# Patient Record
Sex: Male | Born: 1965
Health system: Western US, Academic
[De-identification: ages and names within clinical notes are randomized; demographics above are authoritative.]

---

## 2017-06-28 ENCOUNTER — Ambulatory Visit: Payer: Worker's Compensation

## 2017-06-28 ENCOUNTER — Ambulatory Visit: Payer: BLUE CROSS/BLUE SHIELD | Attending: Cardiovascular Disease

## 2017-06-28 DIAGNOSIS — I10 Essential (primary) hypertension: Secondary | ICD-10-CM

## 2017-06-28 DIAGNOSIS — R638 Other symptoms and signs concerning food and fluid intake: Secondary | ICD-10-CM

## 2017-06-28 DIAGNOSIS — H35039 Hypertensive retinopathy, unspecified eye: Secondary | ICD-10-CM

## 2017-06-28 DIAGNOSIS — E7849 Other hyperlipidemia: Secondary | ICD-10-CM

## 2017-06-28 NOTE — Patient Instructions
1. Record home blood pressure log for 1 week. Measure blood pressure twice a day. On day 1: check breakfast and dinner and on day 2: check lunch and bedtime.   2.

## 2017-06-28 NOTE — Consults
PRIMARY CARE PHYSICIAN: Office, Doctor's, MD    CARDIOLOGY CONSULTATION    History of Present Illness: As you know Paul Waller is a 52 y/o male who presents for evaluation of HTN and HL. Relatively inactive due to back with no CP, SOB, DOE, orthopnea, PND, palpitations or syncope. Patient seen by retina specialist and found to have retinopathy.     Review of Systems:  Gen: no fevers, chills, or night sweats.  HEENT: no acute visual or hearing changes; chronic hearing impairment; no new oral lesions or excess secretions.  CV: no chest pain, orthopnea, PND, palpitations, or syncope.  Resp: no SOB, DOE, cough, or hemoptysis.  GI: no diarrhea, constipation, hematochezia, or melena.  GU: no dysuria, urgency, or hematuria.  Musc: no acute joint swelling but R shoulder pain.  Skin: no acute rashes.  Neuro: no acute numbness, weakness, or seizures.  Psych: no anxiety or depression.  Endocrine: no heat or cold intolerance.  Heme/Lymph: no spontaneous bruising, bleeding, or lymph node swelling.  Allergic/Immunologic: no acute allergic reactions or hives.  Complete 14 system ROS performed and rest of ROS otherwise negative except as documented above.    Past Medical History:   Diagnosis Date   ??? Colon polyp    ??? Diverticulosis    ??? High cholesterol    ??? Hypertension    ??? Hypertensive retinopathy        Past Surgical History:   Procedure Laterality Date   ??? BACK SURGERY         Family History   Problem Relation Age of Onset   ??? No Known Problems Mother    ??? Heart disease Father         s/p CABG and stents with procedures during age 59's.    ??? No Known Problems Sister    ??? No Known Problems Brother    ??? No Known Problems Maternal Aunt    ??? No Known Problems Maternal Uncle    ??? No Known Problems Paternal Aunt    ??? No Known Problems Paternal Uncle    ??? No Known Problems Maternal Grandmother    ??? No Known Problems Maternal Grandfather    ??? No Known Problems Paternal Grandmother    ??? No Known Problems Paternal Grandfather Social History   Substance Use Topics   ??? Smoking status: Light Tobacco Smoker     Types: Cigars   ??? Smokeless tobacco: Never Used   ??? Alcohol use Yes       No Known Allergies    Medications that the patient states to be currently taking   Medication Sig   ??? amLODIPine-atorvastatin 10-20 mg tablet Take 1 tablet by mouth daily.   ??? meloxicam 15 mg tablet Take 15 mg by mouth daily.   ??? valsartan 80 mg tablet Take 80 mg by mouth daily.       EXAM:  BP 136/76  ~ Pulse 74  ~ Temp 36.8 ???C (98.2 ???F) (Oral)  ~ Ht 5' 10'' (1.778 m)  ~ Wt 190 lb (86.2 kg)  ~ SpO2 96%  ~ BMI 27.26 kg/m???   GEN: awake and alert x3, no acute distress. Able to converse.  HEENT: PERRL, EOMI,moist conjunctivae, oral cavity clear; ears and nose atraumatic.   NECK: No significant JVD; JVP <7cm H2O; Carotid upstroke normal with +2 carotids bilaterally with no significant bruits. No thyroid enlargement.  HEART: RRR with normal S1 and S2 with no S3 or S4 with no significant murmurs.  LUNGS:  CTAB bilaterally with no rales or wheezes. Normal respiratory effort.  ABD: soft, NT, ND, +BS.   EXT: non-tender with no edema and no varices.  VASC: +2 brachial and radial pulses bilaterally.  NEURO: non-focal with gross motor tone intact with sensation intact to light touch.  MUSC: no joint swelling or effusions with normal range of movement of upper and lower extremities.  SKIN: no acute rashes.  LYMPH: No lymphadenopathy along neck.    06/28/17 ECG: normal sinus rhythm with rate of 68 with QRS 82, QTc 389, PR 162.    ASSESSMENT:  1. Hypertension.  2. Hyperlipidemia.  3. Hypertensive retinopathy.  4. Diverticulosis/colon polyp.  5. Increased BMI.    PLAN:  1. Reviewed outside records. Will need to obtain most recent labs.  2. Patient to record home blood pressure log to determine need for adjustment in regimen.  3. Discussed dietary measures to optimize cardiac risk and weight. Patient to work on sodium restriction. 4. ECG performed and analyzed with no high risk features. Echocardiogram to assess for any structural heart disease and will need to consider risk stratification.  5. Continue current regimen for now. Will need to calculate long term risk after labs obtained and determine if aspirin needed for primary prevention.  6. Encouraged regular aerobic activity.  7. Will coordinate f/u visit in 4-5 weeks.     Thank you for the opportunity to participate in your patient's care.    Sincerely yours,    Paul Gasser, MD, [facility], [place]  [facility]

## 2017-08-09 ENCOUNTER — Ambulatory Visit: Payer: BLUE CROSS/BLUE SHIELD | Attending: Cardiovascular Disease

## 2017-08-09 DIAGNOSIS — H35039 Hypertensive retinopathy, unspecified eye: Secondary | ICD-10-CM

## 2017-08-09 DIAGNOSIS — E7849 Other hyperlipidemia: Secondary | ICD-10-CM

## 2017-08-09 NOTE — Progress Notes
PRIMARY CARE PHYSICIAN: Office, Doctor's, MD    CARDIOLOGY FOLLOW UP VISIT:    S: As you know Paul Waller is a 52 y/o male who presents for evaluation of HTN and HL. Patient has been working on improving diet and eating more vegetables. Patient home BP had morning high pressures. Patient with no CP, SOB, DOE, orthopnea, PND, palpitations or syncope.     REVIEW OF SYSTEMS:  Gen: no fevers, chills, or night sweats.  HEENT: no acute visual or hearing changes; chronic hearing impairment; no new oral lesions or excess secretions.  CV: no chest pain, orthopnea, PND, palpitations, or syncope.  Resp: no SOB, DOE, cough, or hemoptysis.  GI: no diarrhea, constipation, hematochezia, or melena.  GU: no dysuria, urgency, or hematuria.  Musc: no acute joint swelling but R shoulder pain.  Skin: no acute rashes.  Neuro: no acute numbness, weakness, or seizures.  Psych: no anxiety or depression.  Endocrine: no heat or cold intolerance.  Heme/Lymph: no spontaneous bruising, bleeding, or lymph node swelling.  Allergic/Immunologic: no acute allergic reactions or hives.  Complete 14 system ROS performed and rest of ROS otherwise negative except as documented above.    Past Medical History:   Diagnosis Date   ??? Colon polyp    ??? Diverticulosis    ??? High cholesterol    ??? Hypertension    ??? Hypertensive retinopathy        Past Surgical History:   Procedure Laterality Date   ??? BACK SURGERY         Family History   Problem Relation Age of Onset   ??? No Known Problems Mother    ??? Heart disease Father         s/p CABG and stents with procedures during age 83's.    ??? No Known Problems Sister    ??? No Known Problems Brother    ??? No Known Problems Maternal Aunt    ??? No Known Problems Maternal Uncle    ??? No Known Problems Paternal Aunt    ??? No Known Problems Paternal Uncle    ??? No Known Problems Maternal Grandmother    ??? No Known Problems Maternal Grandfather    ??? No Known Problems Paternal Grandmother    ??? No Known Problems Paternal Grandfather Social History Main Topics   ??? Smoking status: Light Tobacco Smoker     Types: Cigars   ??? Smokeless tobacco: Never Used      Comment: quit cigarette use 14 years ago for 20 yrs.   ??? Alcohol use Yes   ??? Drug use: Unknown       No Known Allergies    Medications that the patient states to be currently taking   Medication Sig   ??? amLODIPine-atorvastatin 10-20 mg tablet Take 1 tablet by mouth daily.   ??? meloxicam 15 mg tablet Take 15 mg by mouth daily.   ??? valsartan 80 mg tablet Take 80 mg by mouth daily.       EXAM:  BP 109/70  ~ Pulse 75  ~ Temp 36.3 ???C (97.4 ???F)  ~ Ht 5' 10'' (1.778 m)  ~ Wt 189 lb 9.6 oz (86 kg)  ~ SpO2 98%  ~ BMI 27.20 kg/m???   GEN: awake and alert x3, no acute distress. Able to converse.  HEENT: PERRL, EOMI,moist conjunctivae, oral cavity clear; ears and nose atraumatic.   NECK: No significant JVD; JVP <7cm H2O; Carotid upstroke normal with +2 carotids bilaterally with no significant bruits.  No thyroid enlargement.  HEART: RRR with normal S1 and S2 with no S3 or S4 with no significant murmurs.  LUNGS: CTAB bilaterally with no rales or wheezes. Normal respiratory effort.  ABD: soft, NT, ND, +BS.   EXT: non-tender with no edema and no varices.  VASC: +2 brachial and radial pulses bilaterally.  NEURO: non-focal with gross motor tone intact with sensation intact to light touch.  MUSC: no joint swelling or effusions with normal range of movement of upper and lower extremities.  SKIN: no acute rashes.  LYMPH: No lymphadenopathy along neck.  ???  06/28/17 ECG: normal sinus rhythm with rate of 68 with QRS 82, QTc 389, PR 162.  ???  07/05/17 Echocardiogram:   1. Normal left ventricular size and wall thickness.   2. Left ventricular ejection fraction is approximately 55 to 60%.   3. The calculated ejection fraction (Simpson's) is 58 %.   4. Normal LV diastolic function.   5. No significant valvular lesions.   6. No pericardial effusion.   7. No pulmonary hypertension.    ASSESSMENT:  1. Hypertension. 2. Hyperlipidemia.  3. Hypertensive retinopathy.  4. Diverticulosis/colon polyp.  5. Increased BMI.  ???  PLAN:  1. Overall cardiac status stable. Patient improving dietary habits.  2. Discussed echocardiogram to assess for structural heart disease.  3. Home BP log with morning hypertension. Patient to take valsartan in am and amlodipine/atorvastatin in evening.  4. Discussed dietary measures to optimize cardiac risk and weight. Patient to work on sodium restriction.  5. Continue current regimen for now. Will need to calculate long term risk after labs obtained and determine if aspirin needed for primary prevention.  6. Encouraged regular aerobic activity.  7. Will coordinate f/u visit in 3 months.   ???  Thank you for the opportunity to continue to participate in your patient's care.    Sincerely yours,     Nani Gasser, MD, [facility], [facility]  [facility]

## 2017-08-09 NOTE — Patient Instructions
1. Take valsartan in am and amlodipine/atorvastatin in evening before sleep.  2. Call in 2-3 weeks to see if labs are received.

## 2017-08-10 NOTE — Progress Notes
Request has been sent

## 2017-08-16 NOTE — Progress Notes
Faxed over request for records.

## 2017-09-06 MED ORDER — MELOXICAM 15 MG PO TABS
15 mg | ORAL_TABLET | Freq: Every day | ORAL | 0 refills
Start: 2017-09-06 — End: ?

## 2017-09-07 MED ORDER — VALSARTAN 80 MG PO TABS
80 mg | ORAL_TABLET | Freq: Every day | ORAL | 3 refills | Status: AC
Start: 2017-09-07 — End: ?

## 2017-09-07 MED ORDER — AMLODIPINE-ATORVASTATIN 10-20 MG PO TABS
1 | ORAL_TABLET | Freq: Every day | ORAL | 3 refills | Status: AC
Start: 2017-09-07 — End: 2018-09-02

## 2017-11-08 ENCOUNTER — Ambulatory Visit: Payer: BLUE CROSS/BLUE SHIELD | Attending: Cardiovascular Disease

## 2017-11-29 DIAGNOSIS — S43431A Superior glenoid labrum lesion of right shoulder, initial encounter: Secondary | ICD-10-CM

## 2017-11-30 ENCOUNTER — Ambulatory Visit: Payer: BLUE CROSS/BLUE SHIELD

## 2017-11-30 DIAGNOSIS — M5030 Other cervical disc degeneration, unspecified cervical region: Secondary | ICD-10-CM

## 2017-11-30 DIAGNOSIS — S39012A Strain of muscle, fascia and tendon of lower back, initial encounter: Secondary | ICD-10-CM

## 2017-11-30 DIAGNOSIS — R52 Pain, unspecified: Secondary | ICD-10-CM

## 2017-11-30 MED ORDER — METHYLPREDNISOLONE 4 MG PO TBPK
ORAL_TABLET | 0 refills | Status: AC
Start: 2017-11-30 — End: ?

## 2017-11-30 NOTE — Progress Notes
Date:  11/30/2017  Name:  Paul Waller  ACCT:#:  [id number]  DOB:   16-Nov-1965  Age:   52 y.o.        [name]. Paul Merl, MD    The patient was seen in our [place] office.     CHIEF COMPLAINTS:  Low back pain    HISTORY OF PRESENT ILLNESS:  The patient is a 52 y.o. male who presents with a 5 day history of low back pain.  He reports that the pain is mainly located across the lower back.  He reports the pain is a 7/10 intensity.  He denies any specific history of trauma.  He reports the pain is better with Aleve and ice.  He has a past medical history significant for hypertension.  He is currently taking Aleve as needed for the pain.  He does report some intermittent pain that radiates his right thigh.  He denies any significant weakness or numbness in the bilateral lower extremities.  He denies any issues with bowel or bladder control.  He denies any saddle anesthesia.  He has not had any imaging.  He has not had any injections.  He denies any known history of cancer.  He has not had a course of physical therapy.    Patient Medical History: The patient???s intake sheet, including past medical history, past surgical history, medicines, allergies, social and family history was reviewed by myself and the patient today, these are noted and documented in the patient???s file.    REVIEW OF SYSTEMS: The review of systems as documented today in the medical record is remarkable for the positive orthopedic problems discussed above and is otherwise non-contributory with respect to Constitutional, ENT, Cardiovascular, GU, Skin, Neurologic, Endocrine, Hematologic, Psychiatric, Gastrointestinal, Respiratory, Eyes and Allergic/Immunologic systems except as noted on the intake form.      OBJECTIVE FINDINGS:     Vitals: Ht 5' 10'' (1.778 m)  ~ Wt 192 lb (87.1 kg)  ~ BMI 27.55 kg/m???     GENERAL: The patient is a well-developed, well nourished male in no acute distress. HEENT: Normocephalic, atraumatic. Pupils equally round, reactive to light. Extraocular motion is intact.   NECK: Supple without lymphadenopathy. Trachea midline.   RESPIRATIONS: Regular and unlabored without abnormal intercostal retractions or abnormal diaphragmatic movement.   CARDIOVASCULAR: No cyanosis, clubbing, or edema.   SKIN: Without lesions, masses, or rash.   PSYCHIATRIC: Normal affect, insight, and mood.     PHYSICAL EXAMINATION:   LUMBAR SPINE EXAMINATION:    Gait and Station  The patient arises from seated to standing without difficulty. The patient stands with level shoulders and pelvis.    Normal lumbar lordosis and thoracic kyphosis. Gait is normal without limp or weakness. Toe and heel walking ar without observed deficit.     Scars/Masses  The skin is normal without lesions or masses.      Range Of Motion   % of Normal   Back  Pain (+/-)  Leg Pain (+/-)    Lumbar Flexion  Moderately decreased   +    -  Lumbar Extension  Moderately decreased   +    -  Right Lateral Flexion Moderately decreased   +    -  Left Lateral Flexion  Moderately decreased   +    -    Motor Strength        Right Left  Hip Flexors    5  5   Quadriceps    5  5  Tibialis Anterior   5  5   Extensor Hallucis Longus 5  5   Ankle Plantarflexors  5  5        Sensation  Light touch sensation is intact in both lower extremities.    Reflexes     Right Left  Patellar       2  2   Achilles       2  2     FABER     Negative Negative  Hip Range of Motion   Normal  Normal   Seated Straight Leg Raise Negative Negative  Supine Straight Leg Raise Negative Negative    Tenderness   Lumbosacral midline   None  Paralumbar muscles   None   Right Sciatic Notch   None  Left Sciatic Notch   None     Spasm in paraspinal muscles  Absent  Leg Lengths    Equal  Thigh Circumference   Equal  Calf Circumference   Equal     RADIOGRAPHS: Taken today at our [facility] office and reviewed by me show: 2 Views of Lumbar Spine: There are five lumbar vertebrae. There is moderate loss of normal lumbar lordosis. Coronal alignment reveals asymmetric disc space collapse at L4-5 (right > than left). Vertebral body heights are within normal limits. Disc heights are severely decreased at L4-5 and L5-S1 There is no evidence of spondylolysis or spondylolisthesis.      DIAGNOSES:  1. Severe lumbar degenerative disc disease L4-S1 with Lumbosacral strain    DISCUSSION: A thorough discussion regarding the patient???s diagnosis was discussed in detail.  All of the patient???s questions were answered prior to the conclusion of this visit.  Patient has no gross neurological deficits on physical exam.  Patient is experiencing low back pain that does not radiate below the knee and decreased range of motion.  I feel that the patient???s symptoms are secondary to significant degenerative disc disease from L4-S1 with lumbosacral strain.  I recommend that the patient undergo a trial of conservative management.  A prescription for physical therapy has been given to the patient for inflammation/edema control, stretching, range of motion, and core strengthening.  The patient will follow-up in our office in 4-6 weeks after completing a course of physical therapy.  If there symptoms fail to improve or the experience any motor or sensory changes they have been instructed to call our office at her earlier time at which point we may consider obtaining a MRI of the lumbar spine.  For pain, he was started on a Medrol Dosepak to be taken as directed.  He was instructed not to take this with any other anti-inflammatory medication.         FOLLOW UP:  4-6 weeks for repeat evaluation    ''Disclaimer: This document was generated using a voice recognition system, which may produce sporadic inaccurate transcription or nonsensical phrases.''

## 2017-12-16 ENCOUNTER — Ambulatory Visit: Attending: Medical

## 2018-01-16 ENCOUNTER — Ambulatory Visit: Attending: Medical

## 2018-01-16 NOTE — Progress Notes
01/16/2018      [place] [employer]  [address]  [place], [place]  [address]        RE:   Paul Waller, Paul Waller  DOB:  1966-03-15  EMP:  [employer] [employer]  D/I:  01/13/2017  CL#:  [id number]  ACCT#:  [id number]      SECONDARY TREATING PHYSICIAN'S PROGRESS REPORT          Barnabas Lister, MD  Gracy Racer, PA-C    CHIEF COMPLAINT:  Right shoulder pain    INTERIM HISTORY: Jacinto presents today for reevaluation of his right shoulder. He began experiencing right shoulder pain packing items into the work truck on January 13, 2017. He was given a cortisone injection several months ago reports that this did not help. Today he reports only minimal improvement with PT. He tried to go back to work with restrictions but reports that this has flared the shoulder pain.  He also reports he has injured his back and is now seeing a spine specialist.  He denies any changes in symptoms since last visit.  He has not done any more physical therapy since last visit.    The pain is dull to sharp in quality, 5/10 in severity, and is localized to the lateral aspect.  He can use have pain with repetitive activities, work, ADLs, and reaching. He admits he has clicking in the right shoulder. He denies instability, locking, catching or swelling. The patient has pain sleeping through the night.    REVIEW OF SYSTEMS: He denies numbness. He denies skin swelling.  Past Medical History:   Diagnosis Date   ??? Colon polyp    ??? Diverticulosis    ??? High cholesterol    ??? Hypertension    ??? Hypertensive retinopathy        SOCIAL HISTORY:    Social History     Social History   ??? Marital status: Married     Spouse name: N/A   ??? Number of children: N/A   ??? Years of education: N/A     Social History Main Topics   ??? Smoking status: Never Smoker   ??? Smokeless tobacco: Never Used      Comment: quit cigarette use 14 years ago for 20 yrs.   ??? Alcohol use Yes   ??? Drug use: Unknown   ??? Sexual activity: Not on file     Other Topics Concern   ??? Not on file Social History Narrative   ??? No narrative on file       PHYSICAL EXAM:   Constitutional: [name] is a 51y-old male in no acute distress.   Psych: He is alert and oriented x3, with a normal mood and affect.  ENT: Hearing is assisted.  Respiratory: Normal rate and effort despite the musculoskeletal condition.   Neuro: Speech is normal. Skin sensation is intact distally.  Vascular: Pulses are intact distally.  Lymphatic: No evidence of Lymphadenopathy or lymphedema.  Skin: No edema. No cyanosis.  Neck: FROM, negative spurling test.  Musculoskeletal:   Right shoulder exam:   He walks with a normal gait.   Pain localizes to the deep anterior.   INSPECTION: Shoulder reveals - effusion, - ecchymosis. Skin is intact.   PALPATION: + acromioclavicular joint tenderness. - bicipital groove tenderness, - lateral deltoid tenderness.   RANGE OF MOTION (degrees): FF:140 with pain, ABD:80 with pain, ER: 30, IR: T11, SER: 60, SIR: 60.   STRENGTH: ABD: 4/5 with 2+ pain, ER: 4-/5 with 2+  pain, IR: 4/5 with 1+ pain, Lag Sign: negative  SPECIAL TESTS: + impingement test. + speeds test. Belly press test cause pain.     IMPRESSION:   1. 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    2. S/p injury at work on 01/13/17.  3. No relief s/p biceps injection 01/31/17.   4. Mild improvement with PT.       TREATMENT PLAN: Reviewed my findings with the patient. I recommended the patient:   1. Resubmit for the surgery of a right shoulder arthroscopy with DCE, bicep tenodesis, SLAP debridement vs repair.  2. Advised to continue with physical therapy as scheduled, we'll resubmit for additional 12 sessions to work on the strengthening of the shoulder to better improve his recovery after the operation.      All questions were answered with stated understanding.      NEXT APPOINTMENT:  The patient will follow up in 6 weeks. DISABILITY STATUS: Temporarily Partially Disabled.     If you have any questions regarding this report, please do not hesitate to contact me.    DISCLOSURE:  I declare under penalty of perjury that I have not violated Labor Code Section 139.3.    The contents of this report and bill are true and correct to the best of my knowledge.    The patient was examined and evaluated by Gracy Racer, PA-C for Barnabas Lister, MD. The evaluation and plan was reviewed and approved by Barnabas Lister, MD.

## 2018-03-03 ENCOUNTER — Ambulatory Visit: Payer: BLUE CROSS/BLUE SHIELD | Attending: Medical

## 2018-03-03 NOTE — Progress Notes
03/03/2018      [employer]  [address]  [place], [place]  [address]        RE:   Paul Waller, Paul Waller  DOB:  02/21/1966  EMP:  [employer]  D/I:  01/13/2017  CL#:  [id number]  ACCT#:  [id number]      SECONDARY TREATING PHYSICIAN'S PROGRESS REPORT          Barnabas Lister, MD  Gracy Racer, PA-C    CHIEF COMPLAINT:  Right shoulder pain    INTERIM HISTORY: Paul Waller presents today for reevaluation of his right shoulder. He began experiencing right shoulder pain packing items into the work truck on January 13, 2017. He was given a cortisone injection several months ago reports that this did not help. Today he reports only minimal improvement with PT. He tried to go back to work with restrictions but reports that this has flared the shoulder pain.  He also reports he has injured his back and is now seeing a spine specialist.  He denies any changes in symptoms since last visit. He continues to have the weakness and tingling throughout the shoulder and arm. He has not done any more physical therapy since last visit.    The pain is dull to sharp in quality, 5/10 in severity, and is localized to the lateral aspect.  He can use have pain with repetitive activities, work, ADLs, and reaching. He admits he has clicking in the right shoulder. He denies instability, locking, catching or swelling. The patient has pain sleeping through the night.    REVIEW OF SYSTEMS: He denies numbness. He denies skin swelling.  Past Medical History:   Diagnosis Date   ??? Colon polyp    ??? Diverticulosis    ??? High cholesterol    ??? Hypertension    ??? Hypertensive retinopathy        SOCIAL HISTORY:    Social History     Social History   ??? Marital status: Married     Spouse name: N/A   ??? Number of children: N/A   ??? Years of education: N/A     Social History Main Topics   ??? Smoking status: Never Smoker   ??? Smokeless tobacco: Never Used      Comment: quit cigarette use 14 years ago for 20 yrs.   ??? Alcohol use Yes   ??? Drug use: Unknown ??? Sexual activity: Not on file     Other Topics Concern   ??? Not on file     Social History Narrative   ??? No narrative on file       PHYSICAL EXAM:   Constitutional: Paul Waller is a 51y-old male in no acute distress.   Psych: He is alert and oriented x3, with a normal mood and affect.  ENT: Hearing is assisted.  Respiratory: Normal rate and effort despite the musculoskeletal condition.   Neuro: Speech is normal. Skin sensation is intact distally.  Vascular: Pulses are intact distally.  Lymphatic: No evidence of Lymphadenopathy or lymphedema.  Skin: No edema. No cyanosis.  Neck: FROM, negative spurling test.  Musculoskeletal:   Right shoulder exam:   He walks with a normal gait.   Pain localizes to the deep anterior.   INSPECTION: Shoulder reveals - effusion, - ecchymosis. Skin is intact.   PALPATION: + acromioclavicular joint tenderness. - bicipital groove tenderness, - lateral deltoid tenderness.   RANGE OF MOTION (degrees): FF:140 with pain, ABD:80 with pain, ER: 30, IR: T11, SER: 60,  SIR: 60.   STRENGTH: ABD: 4/5 with 2+ pain, ER: 4-/5 with 2+ pain, IR: 4/5 with 1+ pain, Lag Sign: negative  SPECIAL TESTS: + impingement test. + speeds test. Belly press test cause pain.     IMPRESSION:   1. 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    2. S/p injury at work on 01/13/17.  3. No relief s/p biceps injection 01/31/17.   4. Mild improvement with PT.       TREATMENT PLAN: Reviewed my findings with the patient. I recommended the patient:   1. The surgery is still pending. So we will resubmit for the surgery of a right shoulder arthroscopy with DCE, bicep tenodesis, SLAP debridement vs repair.  2. Advised to continue with physical therapy as scheduled.      All questions were answered with stated understanding.      NEXT APPOINTMENT:  The patient will follow up in 6 weeks.     DISABILITY STATUS: Temporarily Partially Disabled. If you have any questions regarding this report, please do not hesitate to contact me.    DISCLOSURE:  I declare under penalty of perjury that I have not violated Labor Code Section 139.3.    The contents of this report and bill are true and correct to the best of my knowledge.    The patient was examined and evaluated by Gracy Racer, PA-C for Barnabas Lister, MD. The evaluation and plan was reviewed and approved by Barnabas Lister, MD.

## 2018-04-07 ENCOUNTER — Ambulatory Visit: Payer: BLUE CROSS/BLUE SHIELD | Attending: Medical

## 2018-04-07 ENCOUNTER — Telehealth: Payer: BLUE CROSS/BLUE SHIELD

## 2018-04-07 DIAGNOSIS — R52 Pain, unspecified: Secondary | ICD-10-CM

## 2018-04-07 NOTE — Progress Notes
04/07/2018      [employer] [facility] [employer]  [address]  [place], [place]  [address]        RE:   Paul Waller, Paul Waller  DOB:  19-Nov-1965  EMP:  [employer]  D/I:  01/13/2017  CL#:  [id number]  ACCT#:  [id number]      SECONDARY TREATING PHYSICIAN'S PROGRESS REPORT          Barnabas Lister, MD  Gracy Racer, PA-C    CHIEF COMPLAINT:  Right shoulder pain    INTERIM HISTORY: Paul Waller presents today for reevaluation of his right shoulder. He began experiencing right shoulder pain packing items into the work truck on January 13, 2017. He was given a cortisone injection several months ago reports that this did not help. Today he reports only minimal improvement with PT. He tried to go back to work with restrictions but reports that this has flared the shoulder pain.  He also reports he has injured his back and is now seeing a spine specialist.  Since his last visit, he denies any changes in symptoms. He continues to have the weakness and decreased ROM throughout the shoulder and arm.     The pain is dull to sharp in quality, 5/10 in severity, and is localized to the lateral aspect.  He can use have pain with repetitive activities, work, ADLs, and reaching. He admits he has clicking in the right shoulder. He denies instability, locking, catching or swelling. The patient has pain sleeping through the night.    REVIEW OF SYSTEMS: He denies numbness. He denies skin swelling.  Past Medical History:   Diagnosis Date   ??? Colon polyp    ??? Diverticulosis    ??? High cholesterol    ??? Hypertension    ??? Hypertensive retinopathy        SOCIAL HISTORY:    Social History     Socioeconomic History   ??? Marital status: Married     Spouse name: Not on file   ??? Number of children: Not on file   ??? Years of education: Not on file   ??? Highest education level: Not on file   Occupational History   ??? Not on file   Social Needs   ??? Financial resource strain: Not on file   ??? Food insecurity:     Worry: Not on file     Inability: Not on file ??? Transportation needs:     Medical: Not on file     Non-medical: Not on file   Tobacco Use   ??? Smoking status: Never Smoker   ??? Smokeless tobacco: Never Used   ??? Tobacco comment: quit cigarette use 14 years ago for 20 yrs.   Substance and Sexual Activity   ??? Alcohol use: Yes   ??? Drug use: Not on file   ??? Sexual activity: Not on file   Lifestyle   ??? Physical activity:     Days per week: Not on file     Minutes per session: Not on file   ??? Stress: Not on file   Relationships   ??? Social connections:     Talks on phone: Not on file     Gets together: Not on file     Attends religious service: Not on file     Active member of club or organization: Not on file     Attends meetings of clubs or organizations: Not on file     Relationship status: Not on file  Other Topics Concern   ??? Not on file   Social History Narrative   ??? Not on file       PHYSICAL EXAM:   Constitutional: Paul Waller is a 51y-old male in no acute distress.   Psych: He is alert and oriented x3, with a normal mood and affect.  ENT: Hearing is assisted.  Respiratory: Normal rate and effort despite the musculoskeletal condition.   Neuro: Speech is normal. Skin sensation is intact distally.  Vascular: Pulses are intact distally.  Lymphatic: No evidence of Lymphadenopathy or lymphedema.  Skin: No edema. No cyanosis.  Neck: FROM, negative spurling test.  Musculoskeletal:   Right shoulder exam:   He walks with a normal gait.   Pain localizes to the deep anterior.   INSPECTION: Shoulder reveals - effusion, - ecchymosis. Skin is intact.   PALPATION: + acromioclavicular joint tenderness. - bicipital groove tenderness, - lateral deltoid tenderness.   RANGE OF MOTION (degrees): FF:140 with pain, ABD:80 with pain, ER: 30, IR: T11, SER: 60, SIR: 60.   STRENGTH: ABD: 4/5 with 2+ pain, ER: 4-/5 with 2+ pain, IR: 4/5 with 1+ pain, Lag Sign: negative  SPECIAL TESTS: + impingement test. + speeds test. Belly press test cause pain.     IMPRESSION: 1. 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    2. S/p injury at work on 01/13/17.  3. No relief s/p biceps injection 01/31/17.   4. Mild improvement with PT.       TREATMENT PLAN: Reviewed my findings with the patient. I recommended the patient:   1. Resubmit for the surgery of a right shoulder arthroscopy with DCE, bicep tenodesis, SLAP debridement vs repair.  2. While awaiting surgery advised to continue with rest, ice, and NSAIDs.  3. Advised to continue with physical therapy as scheduled.      All questions were answered with stated understanding.      NEXT APPOINTMENT:  The patient will follow up in 6 weeks.     DISABILITY STATUS: Temporarily Partially Disabled.     If you have any questions regarding this report, please do not hesitate to contact me.    DISCLOSURE:  I declare under penalty of perjury that I have not violated Labor Code Section 139.3.    The contents of this report and bill are true and correct to the best of my knowledge.    The patient was examined and evaluated by Gracy Racer, PA-C for Barnabas Lister, MD. The evaluation and plan was reviewed and approved by Barnabas Lister, MD.

## 2018-06-02 ENCOUNTER — Ambulatory Visit: Payer: Worker's Compensation | Attending: Medical

## 2018-06-06 ENCOUNTER — Ambulatory Visit: Payer: Worker's Compensation | Attending: Medical

## 2018-06-06 NOTE — Progress Notes
06/06/2018      [employer] [facility] [employer]  [address]  [place], [place]  [address]        RE:   Paul, Waller  DOB:  1966-03-14  EMP:  [employer]  D/I:  01/13/2017  CL#:  [id number]  ACCT#:  [id number]      SECONDARY TREATING PHYSICIAN'S PROGRESS REPORT          Barnabas Lister, MD  Gracy Racer, PA-C    CHIEF COMPLAINT:  Right shoulder pain    INTERIM HISTORY: Paul Waller presents today for reevaluation of his right shoulder. He began experiencing right shoulder pain packing items into the work truck on January 13, 2017. He was given a cortisone injection several months ago reports that this did not help. Today he reports only minimal improvement with PT. He tried to go back to work with restrictions but reports that this has flared the shoulder pain.  He also reports he has injured his back and is now seeing a spine specialist.  Since his last visit, he denies any changes in symptoms. He continues to have the weakness and decreased ROM throughout the shoulder and arm.     The pain is dull to sharp in quality, 5/10 in severity, and is localized to the lateral aspect.  He can use have pain with repetitive activities, work, ADLs, and reaching. He admits he has clicking in the right shoulder. He denies instability, locking, catching or swelling. The patient has pain sleeping through the night.    REVIEW OF SYSTEMS: He denies numbness. He denies skin swelling.  Past Medical History:   Diagnosis Date   ??? Colon polyp    ??? Diverticulosis    ??? High cholesterol    ??? Hypertension    ??? Hypertensive retinopathy        SOCIAL HISTORY:    Social History     Socioeconomic History   ??? Marital status: Married     Spouse name: Not on file   ??? Number of children: Not on file   ??? Years of education: Not on file   ??? Highest education level: Not on file   Occupational History   ??? Not on file   Social Needs   ??? Financial resource strain: Not on file   ??? Food insecurity:     Worry: Not on file     Inability: Not on file ??? Transportation needs:     Medical: Not on file     Non-medical: Not on file   Tobacco Use   ??? Smoking status: Never Smoker   ??? Smokeless tobacco: Never Used   ??? Tobacco comment: quit cigarette use 14 years ago for 20 yrs.   Substance and Sexual Activity   ??? Alcohol use: Yes   ??? Drug use: Not on file   ??? Sexual activity: Not on file   Lifestyle   ??? Physical activity:     Days per week: Not on file     Minutes per session: Not on file   ??? Stress: Not on file   Relationships   ??? Social connections:     Talks on phone: Not on file     Gets together: Not on file     Attends religious service: Not on file     Active member of club or organization: Not on file     Attends meetings of clubs or organizations: Not on file     Relationship status: Not on file  Other Topics Concern   ??? Not on file   Social History Narrative   ??? Not on file       PHYSICAL EXAM:   Constitutional: Paul Waller is a 51y-old male in no acute distress.   Psych: He is alert and oriented x3, with a normal mood and affect.  ENT: Hearing is assisted.  Respiratory: Normal rate and effort despite the musculoskeletal condition.   Neuro: Speech is normal. Skin sensation is intact distally.  Vascular: Pulses are intact distally.  Lymphatic: No evidence of Lymphadenopathy or lymphedema.  Skin: No edema. No cyanosis.  Neck: FROM, negative spurling test.  Musculoskeletal:   Right shoulder exam:   He walks with a normal gait.   Pain localizes to the deep anterior.   INSPECTION: Shoulder reveals - effusion, - ecchymosis. Skin is intact.   PALPATION: + acromioclavicular joint tenderness. - bicipital groove tenderness, - lateral deltoid tenderness.   RANGE OF MOTION (degrees): FF:140 with pain, ABD:80 with pain, ER: 30, IR: T11, SER: 60, SIR: 60.   STRENGTH: ABD: 4/5 with 2+ pain, ER: 4-/5 with 2+ pain, IR: 4/5 with 1+ pain, Lag Sign: negative  SPECIAL TESTS: + impingement test. + speeds test. Belly press test cause pain.     IMPRESSION: 1. 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    2. S/p injury at work on 01/13/17.  3. No relief s/p biceps injection 01/31/17.   4. Mild improvement with PT.       TREATMENT PLAN: Reviewed my findings with the patient. I recommended the patient:   1. The surgery is approved but is pending scheduling. Plan to proceed with the right shoulder arthroscopy with DCE, bicep tenodesis, SLAP debridement vs repair.  2. While awaiting surgery advised to continue with rest, ice, and NSAIDs.  3. Advised to continue with physical therapy as scheduled.      All questions were answered with stated understanding.      NEXT APPOINTMENT:  The patient will follow up in 6 weeks.     DISABILITY STATUS: Temporarily Partially Disabled.     If you have any questions regarding this report, please do not hesitate to contact me.    DISCLOSURE:  I declare under penalty of perjury that I have not violated Labor Code Section 139.3.    The contents of this report and bill are true and correct to the best of my knowledge.    The patient was examined and evaluated by Gracy Racer, PA-C for Barnabas Lister, MD. The evaluation and plan was reviewed and approved by Barnabas Lister, MD.

## 2018-07-21 ENCOUNTER — Ambulatory Visit: Payer: BLUE CROSS/BLUE SHIELD | Attending: Medical

## 2018-07-21 NOTE — Progress Notes
07/21/2018      [employer] [facility] [employer]  [address]  [place], [place]  [address]        RE:   Paul, Waller  DOB:  1965-06-04  EMP:  [employer]  D/I:  01/13/2017  CL#:  [id number]  ACCT#:  [id number]      SECONDARY TREATING PHYSICIAN'S PROGRESS REPORT          Barnabas Lister, MD  Gracy Racer, PA-C    CHIEF COMPLAINT:  Right shoulder pain    INTERIM HISTORY: Paul Waller presents today for reevaluation of his right shoulder. He began experiencing right shoulder pain packing items into the work truck on January 13, 2017. He was given a cortisone injection several months ago reports that this did not help. Today he reports only minimal improvement with PT. He tried to go back to work with restrictions but reports that this has flared the shoulder pain. He also reports he has injured his back and is now seeing a spine specialist. Since his last visit, he reports that his symptoms have remained unchanged. He continues to have the weakness and decreased ROM throughout the shoulder and arm.     The pain is dull to sharp in quality, 5/10 in severity, and is localized to the lateral aspect.  He can use have pain with repetitive activities, work, ADLs, and reaching. He admits he has clicking in the right shoulder. He denies instability, locking, catching or swelling. The patient has pain sleeping through the night.    REVIEW OF SYSTEMS: He denies numbness. He denies skin swelling.  Past Medical History:   Diagnosis Date   ??? Colon polyp    ??? Diverticulosis    ??? High cholesterol    ??? Hypertension    ??? Hypertensive retinopathy        SOCIAL HISTORY:    Social History     Socioeconomic History   ??? Marital status: Married     Spouse name: Not on file   ??? Number of children: Not on file   ??? Years of education: Not on file   ??? Highest education level: Not on file   Occupational History   ??? Not on file   Social Needs   ??? Financial resource strain: Not on file   ??? Food insecurity:     Worry: Not on file     Inability: Not on file ??? Transportation needs:     Medical: Not on file     Non-medical: Not on file   Tobacco Use   ??? Smoking status: Never Smoker   ??? Smokeless tobacco: Never Used   ??? Tobacco comment: quit cigarette use 14 years ago for 20 yrs.   Substance and Sexual Activity   ??? Alcohol use: Yes   ??? Drug use: Not on file   ??? Sexual activity: Not on file   Lifestyle   ??? Physical activity:     Days per week: Not on file     Minutes per session: Not on file   ??? Stress: Not on file   Relationships   ??? Social connections:     Talks on phone: Not on file     Gets together: Not on file     Attends religious service: Not on file     Active member of club or organization: Not on file     Attends meetings of clubs or organizations: Not on file     Relationship status: Not on file  Other Topics Concern   ??? Not on file   Social History Narrative   ??? Not on file       PHYSICAL EXAM:   Constitutional: Paul Waller is a 51y-old male in no acute distress.   Psych: He is alert and oriented x3, with a normal mood and affect.  ENT: Hearing is assisted.  Respiratory: Normal rate and effort despite the musculoskeletal condition.   Neuro: Speech is normal. Skin sensation is intact distally.  Vascular: Pulses are intact distally.  Lymphatic: No evidence of Lymphadenopathy or lymphedema.  Skin: No edema. No cyanosis.  Neck: FROM, negative spurling test.  Musculoskeletal:   Right shoulder exam:   He walks with a normal gait.   Pain localizes to the deep anterior.   INSPECTION: Shoulder reveals - effusion, - ecchymosis. Skin is intact.   PALPATION: + acromioclavicular joint tenderness. - bicipital groove tenderness, - lateral deltoid tenderness.   RANGE OF MOTION (degrees): FF:140 with pain, ABD:80 with pain, ER: 30, IR: T11, SER: 60, SIR: 60.   STRENGTH: ABD: 4/5 with 2+ pain, ER: 4-/5 with 2+ pain, IR: 4/5 with 1+ pain, Lag Sign: negative  SPECIAL TESTS: + impingement test. + speeds test. Belly press test cause pain.     IMPRESSION: 1. 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    2. S/p injury at work on 01/13/17.  3. No relief s/p biceps injection 01/31/17.   4. Mild improvement with PT.       TREATMENT PLAN: Reviewed my findings with the patient. I recommended the patient:   1. Surgery is approved but is pending scheduling. Plan to proceed with the right shoulder arthroscopy with DCE, bicep tenodesis, SLAP debridement vs repair.  2. While awaiting surgery advised to continue with rest, ice, and NSAIDs.  3. Advised to continue with physical therapy as scheduled.      All questions were answered with stated understanding.      NEXT APPOINTMENT:  The patient will follow up in 6 weeks.     DISABILITY STATUS: Temporarily Partially Disabled.     If you have any questions regarding this report, please do not hesitate to contact me.    DISCLOSURE:  I declare under penalty of perjury that I have not violated Labor Code Section 139.3.    The contents of this report and bill are true and correct to the best of my knowledge.    The patient was examined and evaluated by Gracy Racer, PA-C for Barnabas Lister, MD. The evaluation and plan was reviewed and approved by Barnabas Lister, MD.

## 2018-09-01 MED ORDER — AMLODIPINE-ATORVASTATIN 10-20 MG PO TABS
ORAL_TABLET | 0 refills | Status: AC
Start: 2018-09-01 — End: ?

## 2018-09-08 ENCOUNTER — Ambulatory Visit: Payer: BLUE CROSS/BLUE SHIELD | Attending: Medical

## 2018-09-08 NOTE — Progress Notes
A COVID-19 questionnaire was filled out by the patient including a positive COVID-19 diagnosis in the last 14 days, contact with anybody diagnosed with  COVID-19 in the last 14 days, fever, headaches, muscle pain, weakness, and diarrhea nausea vomiting abdominal pain, respiratory illness/cough, shortness of breath, loss of smell, loss of taste, rash skin irritation, unexplained hemorrhage, and fatigue. The responses were negative. Temperature was taken and it was less than 100 F.     09/08/2018      [place] [facility] [employer]  [address]  [place], [place]  [address]        RE:   ASEEL, UHDE  DOB:  06-08-1965  EMP:  [employer] [employer]  D/I:  01/13/2017  CL#:  [id number]  ACCT#:  [id number]      SECONDARY TREATING PHYSICIAN'S PROGRESS REPORT          Barnabas Lister, MD  Gracy Racer, PA-C    CHIEF COMPLAINT:  Right shoulder pain    INTERIM HISTORY: Antwan presents today for reevaluation of his right shoulder. He began experiencing right shoulder pain packing items into the work truck on January 13, 2017. He was given a cortisone injection several months ago reports that this did not help. Today he reports only minimal improvement with PT. He tried to go back to work with restrictions but reports that this has flared the shoulder pain. He also reports he has injured his back and is now seeing a spine specialist. He denies any changes in his symptoms since his last visit. He continues to have the weakness and decreased ROM throughout the shoulder and arm.     The pain is dull to sharp in quality, 5/10 in severity, and is localized to the lateral aspect.  He can use have pain with repetitive activities, work, ADLs, and reaching. He admits he has clicking in the right shoulder. He denies instability, locking, catching or swelling. The patient has pain sleeping through the night.    REVIEW OF SYSTEMS: He denies numbness. He denies skin swelling.  Past Medical History:   Diagnosis Date   ??? Colon polyp    ??? Diverticulosis ??? High cholesterol    ??? Hypertension    ??? Hypertensive retinopathy        SOCIAL HISTORY:    Social History     Socioeconomic History   ??? Marital status: Married     Spouse name: Not on file   ??? Number of children: Not on file   ??? Years of education: Not on file   ??? Highest education level: Not on file   Occupational History   ??? Not on file   Social Needs   ??? Financial resource strain: Not on file   ??? Food insecurity:     Worry: Not on file     Inability: Not on file   ??? Transportation needs:     Medical: Not on file     Non-medical: Not on file   Tobacco Use   ??? Smoking status: Never Smoker   ??? Smokeless tobacco: Never Used   ??? Tobacco comment: quit cigarette use 14 years ago for 20 yrs.   Substance and Sexual Activity   ??? Alcohol use: Yes   ??? Drug use: Not on file   ??? Sexual activity: Not on file   Lifestyle   ??? Physical activity:     Days per week: Not on file     Minutes per session: Not on file   ???  Stress: Not on file   Relationships   ??? Social connections:     Talks on phone: Not on file     Gets together: Not on file     Attends religious service: Not on file     Active member of club or organization: Not on file     Attends meetings of clubs or organizations: Not on file     Relationship status: Not on file   Other Topics Concern   ??? Not on file   Social History Narrative   ??? Not on file       PHYSICAL EXAM:   Constitutional: [name] is a 51y-old male in no acute distress.   Psych: He is alert and oriented x3, with a normal mood and affect.  ENT: Hearing is assisted.  Respiratory: Normal rate and effort despite the musculoskeletal condition.   Neuro: Speech is normal. Skin sensation is intact distally.  Vascular: Pulses are intact distally.  Lymphatic: No evidence of Lymphadenopathy or lymphedema.  Skin: No edema. No cyanosis.  Neck: FROM, negative spurling test.  Musculoskeletal:   Right shoulder exam:   He walks with a normal gait.   Pain localizes to the deep anterior. INSPECTION: Shoulder reveals - effusion, - ecchymosis. Skin is intact.   PALPATION: + acromioclavicular joint tenderness. - bicipital groove tenderness, - lateral deltoid tenderness.   RANGE OF MOTION (degrees): FF:140 with pain, ABD:80 with pain, ER: 30, IR: T11, SER: 60, SIR: 60.   STRENGTH: ABD: 4/5 with 2+ pain, ER: 4-/5 with 2+ pain, IR: 4/5 with 1+ pain, Lag Sign: negative  SPECIAL TESTS: + impingement test. + speeds test. Belly press test cause pain.     IMPRESSION:   1. 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    2. S/p injury at work on 01/13/17.  3. No relief s/p biceps injection 01/31/17.   4. Mild improvement with PT.       TREATMENT PLAN: Reviewed my findings with the patient. I recommended the patient:   1. Surgery is approved but is pending scheduling. He would like to ave the surgery done in Decemeber and will like an extension. He will require a right shoulder arthroscopy with DCE, bicep tenodesis, SLAP debridement vs repair.  2. Submit for TOC to Dr Salli Real  3. While awaiting surgery advised to continue with rest, ice, and NSAIDs.  4. Advised to continue with physical therapy as scheduled.      All questions were answered with stated understanding.      NEXT APPOINTMENT:  The patient will follow up in 6 weeks.     DISABILITY STATUS: Temporarily Partially Disabled.     If you have any questions regarding this report, please do not hesitate to contact me.    DISCLOSURE:  I declare under penalty of perjury that I have not violated Labor Code Section 139.3.    The contents of this report and bill are true and correct to the best of my knowledge.    The patient was examined and evaluated by Gracy Racer, PA-C for Barnabas Lister, MD. The evaluation and plan was reviewed and approved by Barnabas Lister, MD.

## 2018-10-06 ENCOUNTER — Ambulatory Visit: Payer: BLUE CROSS/BLUE SHIELD | Attending: Medical

## 2018-10-06 NOTE — Progress Notes
A COVID-19 questionnaire was filled out by the patient including a positive COVID-19 diagnosis in the last 14 days, contact with anybody diagnosed with  COVID-19 in the last 14 days, fever, headaches, muscle pain, weakness, and diarrhea nausea vomiting abdominal pain, respiratory illness/cough, shortness of breath, loss of smell, loss of taste, rash skin irritation, unexplained hemorrhage, and fatigue. The responses were negative. Temperature was taken and it was less than 100 F.     09/08/2018      [place] [facility]  [address]  [place], [place]  [address]        RE:   Paul Waller, Paul Waller  DOB:  06-08-1965  EMP:  [employer] [employer]  D/I:  01/13/2017  CL#:  [id number]  ACCT#:  [id number]      SECONDARY TREATING PHYSICIAN'S PROGRESS REPORT          Barnabas Lister, MD  Gracy Racer, PA-C    CHIEF COMPLAINT:  Right shoulder pain    INTERIM HISTORY: Paul Waller presents today for reevaluation of his right shoulder. He began experiencing right shoulder pain packing items into the work truck on January 13, 2017. He was given a cortisone injection several months ago reports that this did not help. Today he reports only minimal improvement with PT. He tried to go back to work with restrictions but reports that this has flared the shoulder pain. He also reports he has injured his back and is now seeing a spine specialist. He denies any changes in his symptoms since his last visit. He continues to have the weakness and decreased ROM throughout the shoulder and arm.     The pain is dull to sharp in quality, 6/10 in severity, and is localized to the lateral aspect.  He can use have pain with repetitive activities, work, ADLs, and reaching. He admits he has clicking in the right shoulder. He denies instability, locking, catching or swelling. The patient has pain sleeping through the night.    REVIEW OF SYSTEMS: He denies numbness. He denies skin swelling.  Past Medical History:   Diagnosis Date   ??? Colon polyp    ??? Diverticulosis ??? High cholesterol    ??? Hypertension    ??? Hypertensive retinopathy        SOCIAL HISTORY:    Social History     Socioeconomic History   ??? Marital status: Married     Spouse name: Not on file   ??? Number of children: Not on file   ??? Years of education: Not on file   ??? Highest education level: Not on file   Occupational History   ??? Not on file   Social Needs   ??? Financial resource strain: Not on file   ??? Food insecurity:     Worry: Not on file     Inability: Not on file   ??? Transportation needs:     Medical: Not on file     Non-medical: Not on file   Tobacco Use   ??? Smoking status: Never Smoker   ??? Smokeless tobacco: Never Used   ??? Tobacco comment: quit cigarette use 14 years ago for 20 yrs.   Substance and Sexual Activity   ??? Alcohol use: Yes   ??? Drug use: Not on file   ??? Sexual activity: Not on file   Lifestyle   ??? Physical activity:     Days per week: Not on file     Minutes per session: Not on file   ???  Stress: Not on file   Relationships   ??? Social connections:     Talks on phone: Not on file     Gets together: Not on file     Attends religious service: Not on file     Active member of club or organization: Not on file     Attends meetings of clubs or organizations: Not on file     Relationship status: Not on file   Other Topics Concern   ??? Not on file   Social History Narrative   ??? Not on file       PHYSICAL EXAM:   Constitutional: Paul Waller is a 51y-old male in no acute distress.   Psych: He is alert and oriented x3, with a normal mood and affect.  ENT: Hearing is assisted.  Respiratory: Normal rate and effort despite the musculoskeletal condition.   Neuro: Speech is normal. Skin sensation is intact distally.  Vascular: Pulses are intact distally.  Lymphatic: No evidence of Lymphadenopathy or lymphedema.  Skin: No edema. No cyanosis.  Neck: FROM, negative spurling test.  Musculoskeletal:   Right shoulder exam:   He walks with a normal gait.   Pain localizes to the deep anterior. INSPECTION: Shoulder reveals - effusion, - ecchymosis. Skin is intact.   PALPATION: + acromioclavicular joint tenderness. - bicipital groove tenderness, - lateral deltoid tenderness.   RANGE OF MOTION (degrees): FF:140 with pain, ABD:80 with pain, ER: 30, IR: T11, SER: 60, SIR: 60.   STRENGTH: ABD: 4/5 with 2+ pain, ER: 4-/5 with 2+ pain, IR: 4/5 with 1+ pain, Lag Sign: negative  SPECIAL TESTS: + impingement test. + speeds test. Belly press test cause pain.     IMPRESSION:   1. 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    2. S/p injury at work on 01/13/17.  3. No relief s/p biceps injection 01/31/17.   4. Mild improvement with PT.       TREATMENT PLAN: Reviewed my findings with the patient. I recommended the patient:   1. Surgery is approved but is pending scheduling. He would like to ave the surgery done in Decemeber and will like an extension. He will require a right shoulder arthroscopy with DCE, bicep tenodesis, SLAP debridement vs repair.  2. Follow up with Dr Paul Waller for [facility].   3. While awaiting surgery advised to continue with rest, ice, and NSAIDs.  4. Advised to continue with physical therapy as scheduled.      All questions were answered with stated understanding.      NEXT APPOINTMENT:  The patient will follow up in 6 weeks.     DISABILITY STATUS: Temporarily Partially Disabled.     If you have any questions regarding this report, please do not hesitate to contact me.    DISCLOSURE:  I declare under penalty of perjury that I have not violated Labor Code Section 139.3.    The contents of this report and bill are true and correct to the best of my knowledge.    The patient was examined and evaluated by Gracy Racer, PA-C for Barnabas Lister, MD. The evaluation and plan was reviewed and approved by Barnabas Lister, MD.

## 2018-11-20 ENCOUNTER — Ambulatory Visit: Payer: Worker's Compensation | Attending: Medical

## 2019-08-28 ENCOUNTER — Ambulatory Visit

## 2019-08-28 DIAGNOSIS — Z23 Encounter for immunization: Secondary | ICD-10-CM

## 2019-11-05 ENCOUNTER — Non-Acute Institutional Stay

## 2019-11-05 DIAGNOSIS — M545 Low back pain: Secondary | ICD-10-CM

## 2019-11-28 ENCOUNTER — Ambulatory Visit

## 2019-11-28 DIAGNOSIS — M5136 Other intervertebral disc degeneration, lumbar region: Secondary | ICD-10-CM

## 2019-11-28 DIAGNOSIS — M5416 Radiculopathy, lumbar region: Secondary | ICD-10-CM

## 2019-11-28 DIAGNOSIS — S39012A Strain of muscle, fascia and tendon of lower back, initial encounter: Secondary | ICD-10-CM

## 2019-11-28 NOTE — Progress Notes
Date:  11/28/2019  Name:  Paul Waller  ACCT:#:  [id number]  DOB:   19-Oct-1965  Age:   54 y.o.        [name]. Charlean Merl, MD    The patient was seen in our [place] office.     CHIEF COMPLAINTS:  Low back pain    HISTORY OF PRESENT ILLNESS:  The patient is a 54 y.o. male who was last seen in my office on December 01, 2019 where he presented with a chronic history of low back pain.  He reported that the pain is mainly located across the lower back.  He reports the pain is a 7/10 intensity.  He denies any specific history of trauma.  He reports the pain is better with Aleve and ice.  He has a past medical history significant for hypertension.  He is currently taking Mobic as needed for the pain.  He does report some intermittent pain that radiates his right thigh.  He denies any significant weakness or numbness in the bilateral lower extremities.  He was diagnosed with severe degenerative disc disease at L4-S1.  He was started on a course of physical therapy.  He reports the physical therapy has failed to give him any pain relief.  He has also undergone a series of epidural injections which have also failed to give him significant pain.  He continues to complain of severe low back pain that is significantly limiting his quality of life.  He does complain of some intermittent right greater than left thigh a posterior buttock pain that also causes dysfunction.  He reports that activities requiring excessive walking and lifting are very difficult.    Patient Medical History: The patient???s intake sheet, including past medical history, past surgical history, medicines, allergies, social and family history was reviewed by myself and the patient today, these are noted and documented in the patient???s file.    REVIEW OF SYSTEMS: The review of systems as documented today in the medical record is remarkable for the positive orthopedic problems discussed above and is otherwise non-contributory with respect to Constitutional, ENT, Cardiovascular, GU, Skin, Neurologic, Endocrine, Hematologic, Psychiatric, Gastrointestinal, Respiratory, Eyes and Allergic/Immunologic systems except as noted on the intake form.      OBJECTIVE FINDINGS:     Vitals: There were no vitals taken for this visit.    GENERAL: The patient is a well-developed, well nourished male in no acute distress.   HEENT: Normocephalic, atraumatic. Pupils equally round, reactive to light. Extraocular motion is intact.   NECK: Supple without lymphadenopathy. Trachea midline.   RESPIRATIONS: Regular and unlabored without abnormal intercostal retractions or abnormal diaphragmatic movement.   CARDIOVASCULAR: No cyanosis, clubbing, or edema.   SKIN: Without lesions, masses, or rash.   PSYCHIATRIC: Normal affect, insight, and mood.     PHYSICAL EXAMINATION:   LUMBAR SPINE EXAMINATION:    Gait and Station  The patient arises from seated to standing without difficulty. The patient stands with level shoulders and pelvis.    Normal lumbar lordosis and thoracic kyphosis. Gait is normal without limp or weakness. Toe and heel walking ar without observed deficit.     Scars/Masses  The skin is normal without lesions or masses.      Range Of Motion   % of Normal   Back  Pain (+/-)  Leg Pain (+/-)    Lumbar Flexion  Moderately decreased   +    -  Lumbar Extension  Moderately decreased   +    -  Right Lateral Flexion Moderately decreased   +    -  Left Lateral Flexion  Moderately decreased   +    -    Motor Strength        Right Left  Hip Flexors    5  5   Quadriceps    5  5   Tibialis Anterior   5  5   Extensor Hallucis Longus 5  5   Ankle Plantarflexors  5  5        Sensation  Light touch sensation is intact in both lower extremities.    Reflexes     Right Left  Patellar       2  2   Achilles       2  2     FABER     Negative Negative  Hip Range of Motion   Normal  Normal   Seated Straight Leg Raise Negative Negative  Supine Straight Leg Raise Negative Negative    Tenderness   Lumbosacral midline   None  Paralumbar muscles   None   Right Sciatic Notch   None  Left Sciatic Notch   None     Spasm in paraspinal muscles  Absent  Leg Lengths    Equal  Thigh Circumference   Equal  Calf Circumference   Equal     RADIOGRAPHS: Taken at our [facility] office and reviewed by me show: 2 Views of Lumbar Spine: There are five lumbar vertebrae. There is moderate loss of normal lumbar lordosis. Coronal alignment reveals asymmetric disc space collapse at L4-5 (right > than left). Vertebral body heights are within normal limits. Disc heights are severely decreased at L4-5 and L5-S1 There is no evidence of spondylolysis or spondylolisthesis.    MRI of the lumbar spine performed on November 12, 2019 was reviewed.  IMPRESSION:  At L5-S1 there is severe loss of disc height with a 3 mm retrolisthesis and a 4 mm right paracentral and lateral recess osteophyte which mildly effaces the right S1 nerve root within the right lateral recess. Disc bulge with osteophyte loss of disc   height moderate to severely narrows the right neural foramen. Left-sided disc bulge and facet hypertrophy moderate to severely narrows the left neural foramen. Findings suggest a left hemilaminectomy although there is no history of surgery in this could   represent a spina bifida occulta.  ???  At L3-4 there is a 3 to 5 mm left lateral recess and foraminal protrusion which mildly narrows the left lateral recess displacing the left L4 nerve root and slightly displaces the exiting left L3 nerve root within the left neural foramen and far   laterally  ???  At L4-5 disc bulge mild to moderately narrows the right neural foramen with facet hypertrophy      DIAGNOSES:  1. Severe lumbar degenerative disc disease L4-S1 foraminal narrowing and radiculopathy with discogenic and facet mediated back pain    DISCUSSION: A thorough discussion regarding the patient???s diagnosis was discussed in detail.  All of the patient???s questions were answered prior to the conclusion of this visit.  He presents with a several year history of significant low back pain in the setting of severe degenerative disc disease from L4-S1.  He has pain with both lumbar flexion and extension.  He has failed to see significant pain relief with a course of conservative treatment including a series of epidural injections and physical therapy.  Given his persistent pain and symptoms, further treatment options  were discussed today.  Further treatment options discussed for his condition included referral to Pain Management for a facet injection to see if he would be a possible candidate for an ablation procedure.  Other treatment options discussed included an anterior lumbar interbody fusion at L4-5 and L5-S1 with posterior spinal instrumented fusion as a last resort.  A handout regarding this procedure was given to the patient.  If he would like to undergo surgical treatment for his condition, he will contact my office for repeat evaluation.    ''Disclaimer: This document was generated using a voice recognition system, which may produce sporadic inaccurate transcription or nonsensical phrases.''

## 2019-12-20 NOTE — H&P
Paul Waller  Spine Waller  Patient:  Paul Waller  MRN:  [id number]  DOB:  [birth year]  Date of Visit: 12/25/2019  Visit Type: New Visit  Requesting Provider: Self-Referral .  Consultation recommendations are faxed/mailed to referring providers.  Reason For Visit: Lower Back Pain    History of Present Illness     Jakobi Thetford is a 54 y.o. male hx of unknown laser spine Waller for disk herniation 2010 who presents with low back pain that has been present for the past 11 years. The pain is described as 8/10, constant, aching and he locates the pain to right low back. It occasionally radiates to his left posterior buttocks with associated numbness/tingling to entire posterior thigh. The pain is made worse with bending and improved with meloxicam. Treatment to date includes [facility] 2011, chiropractor, acupuncture, PT 2020  with no benefit. Patient works at the [employer].    Patient otherwise weakness, saddle anesthesia, bowel/bladder incontinence, or balance changes.     Past Medical History     He  has a past medical history of Colon polyp, Diverticulosis, High cholesterol, Hypertension, and Hypertensive retinopathy.     Past Surgical History     He  has a past surgical history that includes Back Waller.     Medications     He has a current medication list which includes the following prescription(s): meloxicam, valsartan, amlodipine-atorvastatin, and methylprednisolone.     Allergies     No Known Allergies     Social History     He  reports that he has never smoked. He has never used smokeless tobacco. He reports current alcohol use.    Family History     He family history includes Heart disease in his father; No Known Problems in his brother, maternal aunt, maternal grandfather, maternal grandmother, maternal uncle, mother, paternal aunt, paternal grandfather, paternal grandmother, paternal uncle, and sister.    Review of Systems     A 14-system review was obtained and reviewed by me (see chart).  Please refer to the new patient questionnaire dated 12/25/2019 for full details of the ROS.    Physical Examination     Vitals:  BP 137/81  ~ Pulse 73  ~ Temp 36.3 ???C (97.3 ???F) (Forehead)  ~ Ht 5' 10'' (1.778 m)  ~ Wt 183 lb (83 kg)  ~ BMI 26.26 kg/m???    General appearance: NAD, conversant, well nourished and well developed  HEENT:  NCAT, normal sclera, neck supple  Respiratory: No labored breathing  CV: Extremities warm and well perfused  Extremities: No peripheral edema or digital cyanosis  Skin: intact, no rashes or lesions  Psych: Alert and oriented to person, place and time    GAIT:  The patient's gait is antalgic.  The patient is able to heel rise, toe walk, and rise from a full squat.      RANGE OF MOTION: ROM is decreased  Facet loading is neg.    LUMBAR SPINE    Nontender to percussion, no tenderness to palpation at midline and the paraspinal muscles.  Sensation in Lower Extremities: intact    Motor Strength    RT LT   Hip Flexor 5/5 5/5   Hamstrings 5/5 5/5   Quadriceps 5/5 5/5   Gastroc 5/5 5/5   Anterior tibialis 5/5 5/5   Extensor hallucis longus 5/5 5/5     Deep Tendon Reflexes    RT LT   Patellar normoactive normoactive   Achilles  normoactive normoactive     Straight Leg Raise: negative    Fortin's: pos  Patrick's: pos  Pelvic Distraction: neg  Thigh Thrust: neg  Lateral Compression: neg  Gaenslen's: neg    Clonus: none  Babinski: Negative    IMAGING STUDIES:   I personally reviewed the imaging studies (not just the reports) and went over them in detail with the patient at the clinic visit today.  My impression of the imaging is below.    MRI Lumbar Spine 11/12/19: L4-5 and L5-S1 disc protrusion causing R>L moderate subarticular stenosis. L L5-S1 NF stenosis. No central stenosis.    Assessment      54 y.o. male presents with cLBP that occasionally flares up. History, exam, and imaging consistent with possible SI Joint pathology.    Plan/Discussion     The natural history of the patient's diagnosis was discussed in detail along with the advantages and disadvantages of the relative treatment options.     - continue nonoperative management  - Referral for SI joint injection      All questions were answered to the patient's satisfaction. Mika recognized understanding and was satisfied with this plan.     FOLLOWUP: Return if symptoms worsen or fail to improve.  The patient is encouraged to call us with any questions or problems in the interim. Our contact numbers were given to the patient.    I spent 30 minutes face-to-face with the patient, greater than 50% of this time was spent counseling and/or coordination of care for the patients current medical condition.    Thank you Dr. Conan Bowens for allowing [place] to participate in the care of Paul Waller.    Scribe Attestation      Scribe Signature:  I,Charlotte Botz, have assisted Dr. [name] and resident Dr. Delsa Sale with the documentation for Paul Waller on 12/25/2019.    Dr. Candise Bowens [facility]  12/25/2019   I have reviewed this note, written by Nyra Capes and attest that it is an accurate representation of the patient encounter and other events of the outpatient visit except if otherwise noted.     Physician Signatures     Dr. Lucia Estelle, MD   I have examined Paul Waller and have seen the appropriate labs and imaging studies. I formulated the treatment plan and have not edited the note above.  I have discussed the risks and benefits of all procedures discussed and all of the patient's questions were answered.

## 2019-12-25 ENCOUNTER — Inpatient Hospital Stay: Payer: BLUE CROSS/BLUE SHIELD

## 2019-12-25 ENCOUNTER — Ambulatory Visit: Payer: Worker's Compensation

## 2019-12-25 DIAGNOSIS — M545 Low back pain: Secondary | ICD-10-CM

## 2019-12-25 DIAGNOSIS — Z719 Counseling, unspecified: Secondary | ICD-10-CM

## 2020-01-09 ENCOUNTER — Ambulatory Visit

## 2020-01-09 ENCOUNTER — Telehealth: Payer: Worker's Compensation

## 2020-01-09 ENCOUNTER — Ambulatory Visit: Payer: BLUE CROSS/BLUE SHIELD | Attending: Orthopaedic Surgery of the Spine

## 2020-01-09 DIAGNOSIS — M7918 Myalgia, other site: Secondary | ICD-10-CM

## 2020-01-09 DIAGNOSIS — M5137 Other intervertebral disc degeneration, lumbosacral region: Secondary | ICD-10-CM

## 2020-01-09 DIAGNOSIS — M47816 Spondylosis without myelopathy or radiculopathy, lumbar region: Secondary | ICD-10-CM

## 2020-01-09 DIAGNOSIS — M533 Sacrococcygeal disorders, not elsewhere classified: Secondary | ICD-10-CM

## 2020-01-09 NOTE — Consults
PHYSIATRY SPINE CENTER CONSULTATION      Consulting Physician: Raye Sorrow., MD     Attending Physician: Yetta Flock, M.D.    Chief Complaint: back pain.    HPI:  Paul Waller is a 54 y.o. male who presents at the request of [name], [name]., MD for initial consultation and evaluation for treatment options related to the patient's pain complaints.   Patient comes in today with history of back pain for 11 years, of gradual  onset, without inciting event/trauma.  The patient describes the pain as intermittent, Sharp, rated at 7/10 severity, located on the midline without radiation to the Left leg and Right leg.  The pain is worsened by activity and sitting, and improved by medications.  The pain is not worse with sneezing/coughing/laughing.    Previous work-up has included medications, imaging and therapy.  The following have been helpful treatments:  injections, medications, and therapy.  The following treatments have not been helpful in the past: epidural    In the past 3 months, the patient has completed at least 6 weeks of conservative management including physical therapy, chiropractic care, and/or guided home exercise program.    The patient does not describe(s) any new weakness in the bilateral leg(s).  Patient reports no changes in bowel and bladder function.      ALLERGIES: Patient has no known allergies.    CURRENT MEDICATIONS:   Current Outpatient Medications   Medication Sig   ??? amLODIPine 10 mg tablet    ??? ergocalciferol 1250 mcg (50000 units) capsule    ??? meloxicam 15 mg tablet Take 15 mg by mouth daily.   ??? valsartan 80 mg tablet Take 1 tablet (80 mg total) by mouth daily.   ??? AMLODIPINE-ATORVASTATIN 10-20 mg tablet TAKE ONE TABLET BY MOUTH DAILY (Patient not taking: Reported on 12/25/2019)   ??? methylPREDNISolone 4 mg tablet pack Take as directed on package. (Patient not taking: Reported on 12/25/2019.)     No current facility-administered medications for this visit.        PAST MEDICAL HISTORY: Past Medical History:   Diagnosis Date   ??? Colon polyp    ??? Diverticulosis    ??? High cholesterol    ??? Hypertension    ??? Hypertensive retinopathy        PAST SURGICAL HISTORY:   Past Surgical History:   Procedure Laterality Date   ??? BACK SURGERY         SOCIAL HISTORY:   Social History     Socioeconomic History   ??? Marital status: Married     Spouse name: Not on file   ??? Number of children: Not on file   ??? Years of education: Not on file   ??? Highest education level: Not on file   Occupational History   ??? Not on file   Social Needs   ??? Financial resource strain: Not on file   ??? Food insecurity     Worry: Not on file     Inability: Not on file   ??? Transportation needs     Medical: Not on file     Non-medical: Not on file   Tobacco Use   ??? Smoking status: Never Smoker   ??? Smokeless tobacco: Never Used   ??? Tobacco comment: quit cigarette use 14 years ago for 20 yrs.   Substance and Sexual Activity   ??? Alcohol use: Yes   ??? Drug use: Not on file   ??? Sexual activity: Not  on file   Lifestyle   ??? Physical activity     Days per week: Not on file     Minutes per session: Not on file   ??? Stress: Not on file   Relationships   ??? Social Wellsite geologist on phone: Not on file     Gets together: Not on file     Attends religious service: Not on file     Active member of club or organization: Not on file     Attends meetings of clubs or organizations: Not on file     Relationship status: Not on file   Other Topics Concern   ??? Not on file   Social History Narrative   ??? Not on file       FAMILY HISTORY:   Family History   Problem Relation Age of Onset   ??? No Known Problems Mother    ??? Heart disease Father         s/p CABG and stents with procedures during age 36's.    ??? No Known Problems Sister    ??? No Known Problems Brother    ??? No Known Problems Maternal Aunt    ??? No Known Problems Maternal Uncle    ??? No Known Problems Paternal Aunt    ??? No Known Problems Paternal Uncle    ??? No Known Problems Maternal Grandmother    ??? No Known Problems Maternal Grandfather    ??? No Known Problems Paternal Grandmother    ??? No Known Problems Paternal Grandfather    ??? Celiac disease Neg Hx    ??? Cirrhosis Neg Hx    ??? Colon cancer Neg Hx    ??? Colon polyps Neg Hx    ??? Crohn's disease Neg Hx    ??? Esophageal cancer Neg Hx    ??? Inflammatory bowel disease Neg Hx    ??? Irritable bowel syndrome Neg Hx    ??? Liver cancer Neg Hx    ??? Liver disease Neg Hx    ??? Rectal cancer Neg Hx    ??? Stomach cancer Neg Hx    ??? Ulcerative colitis Neg Hx    ??? Colitis Neg Hx    ??? Microscopic Colitis Neg Hx    ??? Cardiorespiratory Failure Neg Hx    ??? Cardiopulmonary Failure Neg Hx    ??? Familial Mediterranean Fever Neg Hx    ??? Temporomandibular Joint Disorder Neg Hx          Review of Systems:  Consititutional:    Fevers - no   Weight change - no  Eyes:    Vision change - no  Ears, Nose, Mouth, Throat:   Headaches - no  Cardiovascular:   Chest pain - no  Respiratory:   Shortness of breath - no  Gastrointestinal:   Stool incontinence - no  Genitourinary:   Urinary incontinence - no  Integumentary:   Rashes - no  Neurological:   Weakness - No   Numbness/tingling - No  Psychiatric:   Depressed mood - no   Sleep problems - no   Anxiety - no  Musculoskeletal:   Other joint swelling - no    PHYSICAL EXAMINATION:    Vital Signs:  R-12 BP 151/91  ~ Pulse 69  ~ Temp 36.3 ???C (97.3 ???F) (Forehead)  ~ Ht 5' 10'' (1.778 m)  ~ Wt 185 lb (83.9 kg)  ~ BMI 26.54 kg/m???     General: well developed,  well nourished, and in no acute distress, alert and oriented x 4.  Cardiovascular/Extremities: Pulse intact distally with no cyanosis, clubbing, or edema.  Respiratory: Normal work of breathing without apnea and no evidence of respiratory distress without use of accessory muscles.  Abd: soft. No tenderness to palpation  Skin: no lesions or rash on trunk, feet, or hands  Lymphatic: No enlarged cervical or inguinal lymph nodes.    Joint examination: functional range of motion for all four extremities at the shoulders, elbows, wrist, hips, knees, and ankles is normal,      Spine:  Lumbar-spine: The lumbar spine is symmetric without kyphosis or scoliosis.   Range of motion is normal in all planes and if limited range is due to pain.  There is not tenderness to palpation in the bilateral lumbar paraspinals.  Negative bilateral FACET loading.   Negative bilateral both straight leg raise and slump test  Negative bilateral FABER's.  Positive bilateral FF test.    Neurological: Normal gait. Sensation is intact to light touch in the upper and lower extremities. Motor exam shows 5/5 for upper and lower extremity muscles tested. Reflexes are symmetric at 2+ for the upper and lower extremities. Muscle tone is normal with no clonus or muscle atrophy. Negative Babinski's. Hoffman's negative.     Sacroiliac joint testing: Patient had   Positive bilateral Gaenslen???s test   Positive bilateral thigh thrust  Positive bilateral FABER Paul Waller test),           Imaging and work-up:  Imaging was reviewed personally by me and demonstrate by reporting the following:  MRI L spine 10/2019  IMPRESSION:  At L5-S1 there is severe loss of disc height with a 3 mm retrolisthesis and a 4 mm right paracentral and lateral recess osteophyte which mildly effaces the right S1 nerve root within the right lateral recess. Disc bulge with osteophyte loss of disc   height moderate to severely narrows the right neural foramen. Left-sided disc bulge and facet hypertrophy moderate to severely narrows the left neural foramen. Findings suggest a left hemilaminectomy although there is no history of surgery in this could   represent a spina bifida occulta.  ???  At L3-4 there is a 3 to 5 mm left lateral recess and foraminal protrusion which mildly narrows the left lateral recess displacing the left L4 nerve root and slightly displaces the exiting left L3 nerve root within the left neural foramen and far   laterally  ???  At L4-5 disc bulge mild to moderately narrows the right neural foramen with facet hypertrophy  ???         Assessment and Plan:   Commentary and Medical Decision Making:    Larico Dimock is a 54 y.o. male who presents to clinic today for evaluation for back pain.  Based on the history, physical examination and evaluation of all available imaging, I have made the following assessment.    Assessment:  1. Bilateral Sacroiliac Joint dysfunction  2. Lumbar radiculitis in a left L5 versus  S1 distribution.  3. Lumbar Disc Herniation  4. Lumbar Strain  5. Lumbar degenerative disk disease  6. Myofascial pain    Plan:   1. Medication(s) as noted below. Primary provider may renew medication, and the patient was advised of side effects and understood and agreed to take the medication as directed.    -Continue current pain meds.    2. Continue HEP    3. For diagnostic and therapeutic purposes, consider:    1.  Bilateral Sacroiliac joint injections with fluoroscopic guidance. Local    4. Follow-up with primary care provider for other medical issues and for further management or medication refills and therapy renewals. The patient can follow-up if they would like any of the options listed above.      I advised the patient that if neurological issues were to develop such as weakness, bowel or bladder control, or worsening pain, that they should present immediately to the closest Emergency Room.    Thank you for this consultation; If I can be of further service, please contact my office at  [phone].         I have personally evaluated and examined the patient in detail. I agree with the findings, diagnosis, and treatment plans listed above. I formulated the plan and recommendations, and I reviewed this with the patient. I have modified the note above to reflect my personal exam findings and impressions, as needed.     Yetta Flock, M.D.

## 2020-01-09 NOTE — Telephone Encounter
I spoke to the patient in clinic today to discuss their orders placed by the PM&R Department today.     During your visit with Dr. Fortunato Curling, the following orders were placed:    ? Pain Procedure - The patient has been scheduled. Procedure confirmation was sent via mychart and given in clinic

## 2020-01-09 NOTE — Patient Instructions
Marland KitchenPLEASE REVIEW INFORMATION BELOW:     ??? For all visits, please be ready to show your photo ID and insurance card(s), and pay any applicable co-pays that will be due at the time of visit.     ??? It is your responsibility to be sure that your health insurance plan covers any lab test, radiology procedure, consultation, orthopaedic procedures which include, but not limit, injections, casting, bracing, durable medical equipment, etc. which has been recommended for you.  Contacting the Office:  ??? Each physician office has a Patient Care Coordinator that will help coordinate your care. They can help with:  ??? General questions  ??? Relay specific questions to the doctor   ??? Schedule surgery  ??? Follow up on authorizations that are pending  ??? Provide paperwork such as a return to work/school letter or disability forms  ??? Please allow 24 hours for messages to be returned from the physician???s office.  ??? There is a 7-10 business day turnaround for all forms.  ??? We encourage you to sign up for [facility] to communicate directly with the office or physician online. Please ask any staff member for additional information.   Following Up with the Physician:  ??? Before leaving, physician will let you know if you will need to follow-up within a certain time. If yes, make sure you schedule your follow-up appointment at the front desk or by calling the centralized scheduling center at:  ???  [phone] Orthopaedic Surgery.  ???  [phone] Orthopaedic Spine Center.  ???  [phone] Orthopaedic[facility].  ??? Did your physician order any labs, imaging, or tests that should be completed before your next follow-up? If yes, please continue reading below.  Physician Order/Pre-authorization:  ??? For any physician orders/pre-authorizations, please allow 5-7 business days for authorization to be obtained.  Imaging (MRI, X-Ray, CT Scan, etc)  ??? If you are scheduling at a [facility], please call Radiology at [phone] to schedule your imaging. You do not need to wait for an authorization to schedule.   ??? If you are scheduling outside of [place]:  ??? Notify your physician???s office of your location choice, this will help in obtaining authorization for the correct facility.  ??? Once you complete the imaging, obtain a copy of the report and images. Provide a copy to your physician for review.  ??? Schedule a follow-up appointment to review the images, unless stated otherwise by your physician or the office.  Injections  ??? If the physician recommends an injection, plan anywhere from 1-5 follow-up visits with your physician in a specific timeframe to complete the treatment plan. The follow-up visits can be as frequent as once a week until all the dosage has been administered.  ??? If authorization is required, allow 5-7 business days for the office to obtain authorization.  Lab work  ??? If labs are ordered, keep in mind:  ??? Fasting or non-fasting?  ??? How soon must they be done? (as soon as possible, one week before next appointment, a month before next appointment, etc.)  ??? Do you need to schedule a follow-up visit to discuss results or did the physician say they will call you with the results?   ??? If lab work is completed at a [facility], your results will automatically be sent to the physician, however allow a few days for results to be completed. Some labs require more time than others for results to finalize.  ??? If lab work is completed outside  a [facility], bring a copy of the lab orders with you. After a few days, follow-up with the lab to obtain a copy of your lab results for your physician.  Physical Therapy  ??? If physical therapy is recommended, notify the office of where you would prefer to seek therapy.  ??? Before doing so, verify with the physical therapy location that your insurance will be accepted.  ??? If authorization is required, allow 5-7 business days for the office to obtain authorization.  ??? If physical therapy is completed at a [facility] facility, the progress reports will automatically be sent to the physician.  ??? Physical therapy requires multiple visits to your chosen facility, anywhere from 2 weeks and more depending on the nature of your case.  Nerve Conduction/EMG Study  ??? If the physician ordered a Nerve Conduction or EMG Study, contact the Refer to Physician Office to schedule your appointment.

## 2020-01-11 ENCOUNTER — Ambulatory Visit: Payer: Worker's Compensation

## 2020-01-14 ENCOUNTER — Ambulatory Visit: Payer: Worker's Compensation

## 2020-01-16 ENCOUNTER — Institutional Professional Consult (permissible substitution)

## 2020-01-16 DIAGNOSIS — M7918 Myalgia, other site: Secondary | ICD-10-CM

## 2020-01-16 DIAGNOSIS — M5137 Other intervertebral disc degeneration, lumbosacral region: Secondary | ICD-10-CM

## 2020-01-16 DIAGNOSIS — M47816 Spondylosis without myelopathy or radiculopathy, lumbar region: Secondary | ICD-10-CM

## 2020-01-16 DIAGNOSIS — M533 Sacrococcygeal disorders, not elsewhere classified: Secondary | ICD-10-CM

## 2020-01-17 LAB — COVID-19 PCR: COVID-19 PCR: NOT DETECTED

## 2020-01-17 NOTE — Op Note
Date of Operation:  01/18/20    Pre-operative Diagnosis:   1. Sacroiliitis  2. Pre-Op Diagnosis Codes:     * Sacroiliac joint pain [M53.3]    Post-operative Diagnosis:   1. Sacroiliitis  2. Pre-Op Diagnosis Codes:     * Sacroiliac joint pain [M53.3]    Surgeon: Yetta Flock, MD  I am the attending surgeon on this case. I was present for all key and critical portions of this case    Operation Title:  1. Bilateral sacroiliac joint injection  2. Fluoroscopic guidance    Anesthesia: Local    Complication(s): None.    Estimated Blood Loss: Minimal.    Specimen(s): None sent.    INDICATIONS:   Back pain.    DESCRIPTION OF PROCEDURE:   Patient had pre-operative COVID-19 testing which was negative on 01/16/20.    Signed informed consent was obtained, and the risks, benefits, and alternatives were discussed with the patient to include but not limited to bleeding, infection, pain at the site of injection, minimal effectiveness of the procedure, nerve damage, weakness and numbness in the extremity, and worsening pain. The patient agrees with the following procedure. The patient understood and agreed to the procedure.     Patient was brought into the OR and draped and prepped in standard sterile fashion in the prone position.     Blood pressure, heart rate, and O2 saturations were monitored throughout the procedure and remained stable throughout.     Using anterior posterior fluoroscopy, the sacroiliac joint on the left and then individually the right side were identified.  Area overlying the inferior margin of each sacroiliac joint was infiltrated with approximately 3 cc of 1% lidocaine for local anesthesia left first and then right.  Then a 22-gauge 3-1/2-inch spinal needle was inserted and directed towards the inferior border of the sacroiliac joint on left first and then the right side using intermittent fluoroscopy guidance.  Once the needle was felt to penetrate the capsule of the sacroiliac joint, 1 cc of contrast dye confirmed uptake into each sacroiliac joint.  Then a solution containing 40 mg of Kenalog and 2 cc of 0.25% preservative-free Marcaine was injected on each side.  The needles were removed and a sterile dressing was applied. The patient tolerated the procedure well. Patient was taken to recovery in stable condition.    PLAN:   Follow-up in clinic. If the patient obtains good relief from symptoms, we will consider radiofrequency of the lateral branch nerves that supply the sacroiliac joint.

## 2020-01-18 ENCOUNTER — Ambulatory Visit: Payer: Worker's Compensation

## 2020-01-18 ENCOUNTER — Inpatient Hospital Stay
Disposition: A | Discharge status: Home / Self Care | Payer: BLUE CROSS/BLUE SHIELD | Source: Home / Self Care | Attending: Orthopaedic Surgery of the Spine

## 2020-01-18 DIAGNOSIS — M533 Sacrococcygeal disorders, not elsewhere classified: Secondary | ICD-10-CM

## 2020-01-18 MED ADMIN — TRIAMCINOLONE ACETONIDE 40 MG/ML IJ SUSP: @ 16:00:00 | Stop: 2020-01-18 | NDC 00003029320

## 2020-01-18 MED ADMIN — BUPIVACAINE HCL (PF) 0.25 % IJ SOLN: @ 16:00:00 | Stop: 2020-01-18 | NDC 00409115901

## 2020-01-18 MED ADMIN — IOHEXOL 300 MG/ML IJ SOLN: @ 16:00:00 | Stop: 2020-01-18 | NDC 00407141310

## 2020-01-18 MED ADMIN — LIDOCAINE HCL (PF) 1 % IJ SOLN: @ 16:00:00 | Stop: 2020-01-18 | NDC 63323049257

## 2020-01-24 ENCOUNTER — Telehealth: Payer: BLUE CROSS/BLUE SHIELD

## 2020-01-24 NOTE — Telephone Encounter
[name] NOTE - TELEPHONE CALLBACK     Paul Waller is a 54 y.o. male who is s/p bilateral sacroiliac joint injection. Spoke with patients wife Paul Waller who states patient is not available right now. She noticed he was able to walk better following injection but that he did complain of some pain over the weekend. He will reach out with percentage relief via mychart when available.     Wife has all contact infomation for the spine service should they have any additional concerns to address      Doristine Locks, DO  PM&R Spine Fellow   Clinical Instructor   [facility] Department of Orthopaedic Surgery

## 2020-02-29 ENCOUNTER — Ambulatory Visit

## 2020-03-03 ENCOUNTER — Ambulatory Visit

## 2020-03-05 ENCOUNTER — Ambulatory Visit: Payer: Worker's Compensation

## 2020-03-06 NOTE — Progress Notes
[facility] Department of Orthopaedic Surgery  Spine Surgery  Patient:  Paul Waller  MRN:  [id number]  DOB:  06-Nov-1965  Date of Visit: 03/11/2020  Visit Type: Return  Requesting Provider: No ref. provider found .  Consultation recommendations are faxed/mailed to referring providers.  Reason For Visit: Follow-up    History of Present Illness     Rafik Koppel is a 54 y.o. male hx of unknown laser spine surgery for disk herniation 2010 who presents for follow up of low back pain that has been present for the past 11 years. Patient had SIJ injection, provided 1 day of relief, but then his pain returned. He started having headache after the injection. Continues to have the pain. The pain is described as 8/10, constant, aching and he locates the pain to right low back. It occasionally radiates to his left posterior buttocks with associated numbness/tingling to entire posterior thigh. The pain is made worse with bending and improved with meloxicam. Treatment to date includes [facility] 2011, chiropractor, acupuncture, PT 2020 with no benefit. Patient works at the [employer].     Patient has been having GI problems. He is taking meloxicam and tramadol.    Patient otherwise weakness, saddle anesthesia, bowel/bladder incontinence, or balance changes.     Past Medical History     He  has a past medical history of Colon polyp, Diverticulosis, High cholesterol, Hypertension, and Hypertensive retinopathy.     Past Surgical History     He  has a past surgical history that includes Back surgery.     Medications     He has a current medication list which includes the following prescription(s): amlodipine, meloxicam, valsartan, amlodipine-atorvastatin, ergocalciferol, and methylprednisolone.     Allergies     No Known Allergies     Social History     He  reports that he has never smoked. He has never used smokeless tobacco. He reports current alcohol use.    Family History     He family history includes Heart disease in his father; No Known Problems in his brother, maternal aunt, maternal grandfather, maternal grandmother, maternal uncle, mother, paternal aunt, paternal grandfather, paternal grandmother, paternal uncle, and sister.    Review of Systems     A 14-system review was obtained and reviewed by me (see chart).  Please refer to the new patient questionnaire dated 03/11/2020 for full details of the ROS.    Physical Examination     Vitals:  Temp 36.2 ???C (97.1 ???F) (Forehead)    General appearance: NAD, conversant, well nourished and well developed  HEENT:  NCAT, normal sclera, neck supple  Respiratory: No labored breathing  CV: Extremities warm and well perfused  Extremities: No peripheral edema or digital cyanosis  Skin: intact, no rashes or lesions  Psych: Alert and oriented to person, place and time    GAIT:  The patient's gait is antalgic.  The patient is able to heel rise, toe walk, and rise from a full squat.      RANGE OF MOTION: ROM is decreased  Facet loading is neg.    LUMBAR SPINE    Nontender to percussion, no tenderness to palpation at midline and the paraspinal muscles.  Sensation in Lower Extremities: intact    Motor Strength    RT LT   Hip Flexor 5/5 5/5   Hamstrings 5/5 5/5   Quadriceps 5/5 5/5   Gastroc 5/5 5/5   Anterior tibialis 5/5 5/5   Extensor hallucis longus 5/5 5/5  Deep Tendon Reflexes    RT LT   Patellar normoactive normoactive   Achilles normoactive normoactive     Straight Leg Raise: negative    Fortin's: pos  Patrick's: pos  Pelvic Distraction: neg  Thigh Thrust: neg  Lateral Compression: neg  Gaenslen's: neg    Clonus: none  Babinski: Negative    IMAGING STUDIES:   I personally reviewed the imaging studies (not just the reports) and went over them in detail with the patient at the clinic visit today.  My impression of the imaging is below.    MRI Lumbar Spine 11/12/19: L4-5 and L5-S1 disc protrusion causing R>L moderate subarticular stenosis. L L5-S1 NF stenosis. No central stenosis.    Assessment      54 y.o. male presents with cLBP that occasionally flares up. History, exam, and imaging consistent with possible SI Joint pathology, although SIJ injection did not provide enough relief to consider SI fusion. Recommend ablation.    Plan/Discussion     The natural history of the patient's diagnosis was discussed in detail along with the advantages and disadvantages of the relative treatment options.     - continue nonoperative management  - Referral for radiofrequency SI ablation   - Recommend he speak with PCP about GI issues  - Follow up PRN    All questions were answered to the patient's satisfaction. Suhas recognized understanding and was satisfied with this plan.     FOLLOWUP: Return if symptoms worsen or fail to improve.  The patient is encouraged to call us with any questions or problems in the interim. Our contact numbers were given to the patient.    I spent 25 minutes face-to-face with the patient, greater than 50% of this time was spent counseling and/or coordination of care for the patients current medical condition.    Thank you Dr. No ref. provider found for allowing [place] to participate in the care of Jean-Pierre Hlavac.    Scribe Therapist, sports Signature:  I,Jaylyn Iyer, have assisted Dr. [name] with the documentation for Asberry Lascola on 03/11/2020.    Dr. Candise Bowens [facility]  03/11/2020   I have reviewed this note, written by Nyra Capes and attest that it is an accurate representation of the patient encounter and other events of the outpatient visit except if otherwise noted.     Physician Signatures     Dr. Lucia Estelle, MD   I have examined Megan Salon and have seen the appropriate labs and imaging studies. I formulated the treatment plan and have not edited the note above.  I have discussed the risks and benefits of all procedures discussed and all of the patient's questions were answered.

## 2020-03-11 ENCOUNTER — Ambulatory Visit

## 2020-03-11 DIAGNOSIS — M545 Low back pain, unspecified back pain laterality, unspecified chronicity, unspecified whether sciatica present: Secondary | ICD-10-CM

## 2020-03-11 NOTE — Addendum Note
Addended by: Sheryn Bison on: 03/11/2020 08:41 AM     Modules accepted: Orders

## 2020-03-19 ENCOUNTER — Ambulatory Visit

## 2020-03-19 ENCOUNTER — Ambulatory Visit: Payer: Worker's Compensation

## 2020-03-19 ENCOUNTER — Institutional Professional Consult (permissible substitution): Payer: BLUE CROSS/BLUE SHIELD

## 2020-03-19 ENCOUNTER — Ambulatory Visit: Attending: Orthopaedic Surgery of the Spine

## 2020-03-19 DIAGNOSIS — M47816 Spondylosis without myelopathy or radiculopathy, lumbar region: Secondary | ICD-10-CM

## 2020-03-19 DIAGNOSIS — M5136 Other intervertebral disc degeneration, lumbar region: Secondary | ICD-10-CM

## 2020-03-19 DIAGNOSIS — M5137 Other intervertebral disc degeneration, lumbosacral region: Secondary | ICD-10-CM

## 2020-03-19 DIAGNOSIS — M7918 Myalgia, other site: Secondary | ICD-10-CM

## 2020-03-19 NOTE — Progress Notes
[facility] PROGRESS NOTE    Attending Physician: Yetta Flock, M.D.    Chief Complaint: back pain.    HPI:  Paul Waller is a 54 y.o. male who presents at the request of [name], [name]., MD for initial consultation and evaluation for treatment options related to the patient's pain complaints.   The patient was last seen on 01/09/2020.  The patient is status post b/l SIJ injections on 01/18/20 without significant relief, helped for ~12 hours the first day, then the pain came back.  51yr hx of gradual onset back pain. The patient describes the pain as intermittent, Sharp, rated at 7/10 severity, located on the midline low back with radiation to the right buttock and into posterior thigh.  The pain is worsened by activity and sitting, and improved by medications.  The pain is not worse with sneezing/coughing/laughing. Pt states he occasionally stumbles as well, unsure if there is associated pain.    In the past 3 months, the patient has completed at least 6 weeks of conservative management including physical therapy, chiropractic care, and/or guided home exercise program.    The patient does not describe(s) any new weakness in the bilateral leg(s).  Patient reports no changes in bowel and bladder function.      ALLERGIES: Patient has no known allergies.    CURRENT MEDICATIONS:   Current Outpatient Medications   Medication Sig   ??? amLODIPine 10 mg tablet    ??? meloxicam 15 mg tablet Take 15 mg by mouth daily.   ??? valsartan 80 mg tablet Take 1 tablet (80 mg total) by mouth daily.   ??? AMLODIPINE-ATORVASTATIN 10-20 mg tablet TAKE ONE TABLET BY MOUTH DAILY (Patient not taking: Reported on 12/25/2019)   ??? ergocalciferol 1250 mcg (50000 units) capsule    ??? methylPREDNISolone 4 mg tablet pack Take as directed on package. (Patient not taking: Reported on 12/25/2019.)     No current facility-administered medications for this visit.        PAST MEDICAL HISTORY:   Past Medical History:   Diagnosis Date   ??? Colon polyp ??? Diverticulosis    ??? High cholesterol    ??? Hypertension    ??? Hypertensive retinopathy        PAST SURGICAL HISTORY:   Past Surgical History:   Procedure Laterality Date   ??? BACK SURGERY         SOCIAL HISTORY:   Social History     Socioeconomic History   ??? Marital status: Married     Spouse name: Not on file   ??? Number of children: Not on file   ??? Years of education: Not on file   ??? Highest education level: Not on file   Occupational History   ??? Not on file   Social Needs   ??? Financial resource strain: Not on file   ??? Food insecurity     Worry: Not on file     Inability: Not on file   ??? Transportation needs     Medical: Not on file     Non-medical: Not on file   Tobacco Use   ??? Smoking status: Never Smoker   ??? Smokeless tobacco: Never Used   ??? Tobacco comment: quit cigarette use 14 years ago for 20 yrs.   Substance and Sexual Activity   ??? Alcohol use: Yes   ??? Drug use: Not on file   ??? Sexual activity: Not on file   Lifestyle   ??? Physical activity  Days per week: Not on file     Minutes per session: Not on file   ??? Stress: Not on file   Relationships   ??? Social Wellsite geologist on phone: Not on file     Gets together: Not on file     Attends religious service: Not on file     Active member of club or organization: Not on file     Attends meetings of clubs or organizations: Not on file     Relationship status: Not on file   Other Topics Concern   ??? Not on file   Social History Narrative   ??? Not on file       FAMILY HISTORY:   Family History   Problem Relation Age of Onset   ??? No Known Problems Mother    ??? Heart disease Father         s/p CABG and stents with procedures during age 42's.    ??? No Known Problems Sister    ??? No Known Problems Brother    ??? No Known Problems Maternal Aunt    ??? No Known Problems Maternal Uncle    ??? No Known Problems Paternal Aunt    ??? No Known Problems Paternal Uncle    ??? No Known Problems Maternal Grandmother    ??? No Known Problems Maternal Grandfather    ??? No Known Problems Paternal Grandmother    ??? No Known Problems Paternal Grandfather    ??? Celiac disease Neg Hx    ??? Cirrhosis Neg Hx    ??? Colon cancer Neg Hx    ??? Colon polyps Neg Hx    ??? Crohn's disease Neg Hx    ??? Esophageal cancer Neg Hx    ??? Inflammatory bowel disease Neg Hx    ??? Irritable bowel syndrome Neg Hx    ??? Liver cancer Neg Hx    ??? Liver disease Neg Hx    ??? Rectal cancer Neg Hx    ??? Stomach cancer Neg Hx    ??? Ulcerative colitis Neg Hx    ??? Colitis Neg Hx    ??? Microscopic Colitis Neg Hx    ??? Cardiorespiratory Failure Neg Hx    ??? Cardiopulmonary Failure Neg Hx    ??? Familial Mediterranean Fever Neg Hx    ??? Temporomandibular Joint Disorder Neg Hx          Review of Systems:  Consititutional:    Fevers - no   Weight change - no  Eyes:    Vision change - no  Ears, Nose, Mouth, Throat:   Headaches - no  Cardiovascular:   Chest pain - no  Respiratory:   Shortness of breath - no  Gastrointestinal:   Stool incontinence - no  Genitourinary:   Urinary incontinence - no  Integumentary:   Rashes - no  Neurological:   Weakness - No   Numbness/tingling - No  Psychiatric:   Depressed mood - no   Sleep problems - no   Anxiety - no  Musculoskeletal:   Other joint swelling - no    PHYSICAL EXAMINATION:    Vital Signs:  R-12 Temp 36.1 ???C (96.9 ???F) (Forehead)     General: well developed, well nourished, and in no acute distress, alert and oriented x 4.  Cardiovascular/Extremities: Pulse intact distally with no cyanosis, clubbing, or edema.  Respiratory: Normal work of breathing without apnea and no evidence of respiratory distress without use of accessory  muscles.  Abd: soft. No tenderness to palpation  Skin: no lesions or rash on trunk, feet, or hands  Lymphatic: No enlarged cervical or inguinal lymph nodes.    Joint examination: functional range of motion for all four extremities at the shoulders, elbows, wrist, hips, knees, and ankles is normal,      Spine:  Lumbar-spine: The lumbar spine is symmetric without kyphosis or scoliosis.   Range of motion is normal in all planes and if limited range is due to pain.  There is not tenderness to palpation in the bilateral lumbar paraspinals.  Marked Positive bilateral FACET loading with guarding  Negative bilateral both straight leg raise and slump test  positive right FABER's.  Negative bilateral FF test.    Neurological: Normal gait. Sensation is intact to light touch in the upper and lower extremities. Motor exam shows 5/5 for upper and lower extremity muscles tested. Reflexes are symmetric at 2+ for the upper and lower extremities. Muscle tone is normal with no clonus or muscle atrophy. Negative Babinski's. Hoffman's negative.     Sacroiliac joint testing: Patient had   Negative bilateral Gaenslen???s test   negative bilateral thigh thrust  Positive right FABER Luisa Hart test),           Imaging and work-up:  Imaging was reviewed personally by me and demonstrate by reporting the following:  MRI L spine 10/2019  IMPRESSION:  At L5-S1 there is severe loss of disc height with a 3 mm retrolisthesis and a 4 mm right paracentral and lateral recess osteophyte which mildly effaces the right S1 nerve root within the right lateral recess. Disc bulge with osteophyte loss of disc   height moderate to severely narrows the right neural foramen. Left-sided disc bulge and facet hypertrophy moderate to severely narrows the left neural foramen. Findings suggest a left hemilaminectomy although there is no history of surgery in this could   represent a spina bifida occulta.  ???  At L3-4 there is a 3 to 5 mm left lateral recess and foraminal protrusion which mildly narrows the left lateral recess displacing the left L4 nerve root and slightly displaces the exiting left L3 nerve root within the left neural foramen and far   laterally  ???  At L4-5 disc bulge mild to moderately narrows the right neural foramen with facet hypertrophy  ???         Assessment and Plan:   Commentary and Medical Decision Making:    Rashaad Hallstrom is a 54 y.o. male who presents to clinic today for evaluation for back pain.  Based on the history, physical examination and evaluation of all available imaging, I have made the following assessment.    Assessment:  1. Bilateral Sacroiliac Joint dysfunction  2. Lumbar radiculitis in a left L5 versus  S1 distribution.  3. Lumbar Disc Herniation  4. Lumbar Strain  5. Lumbar degenerative disk disease  6. Myofascial pain    Plan:   1. Medication(s) as noted below. Primary provider may renew medication, and the patient was advised of side effects and understood and agreed to take the medication as directed.    -Continue current pain meds.    2. Continue HEP    3. For diagnostic and therapeutic purposes, consider:    1. bilateral L3, L4 medial branch and L5 dorsal rami blocks under fluoroscopy. If successful the patient would be a candidate for radiofrequency ablation procedure. Local.    2. If inadequate relief with above, consider PNS to lumbar multifidi  3. status post b/l SIJ injections on 01/18/20 without significant relief, helped for ~12 hours the first day, then the pain came back.    4. Follow-up with primary care provider for other medical issues and for further management or medication refills and therapy renewals. The patient can follow-up if they would like any of the options listed above.      I advised the patient that if neurological issues were to develop such as weakness, bowel or bladder control, or worsening pain, that they should present immediately to the closest Emergency Room.    Thank you for this consultation; If I can be of further service, please contact my office at  [phone].         I have personally evaluated and examined the patient in detail. I agree with the findings, diagnosis, and treatment plans listed above. I formulated the plan and recommendations, and I reviewed this with the patient. I have modified the note above to reflect my personal exam findings and impressions, as needed. Yetta Flock, M.D.

## 2020-03-20 LAB — COVID-19 PCR

## 2020-03-20 NOTE — Op Note
Date of Operation:  03/21/20    Pre-operative Diagnosis:  1. Lumbar spondylosis M47.9 Spondylosis, unspecified  2. Degenerative disc disease, lumbar spine  3. Pre-Op Diagnosis Codes:     * Lumbar spondylosis [M47.816]    Post-operative Diagnosis:   1. Lumbar spondylosis M47.9 Spondylosis, unspecified  2. Degenerative disc disease, lumbar spine  3. Pre-Op Diagnosis Codes:     * Lumbar spondylosis [Y86.578]    Surgeon: Yetta Flock, MD  I am the attending surgeon on this case. I was present for all key and critical portions of this case    Operation Title:  1. Bilateral L45, L5S1 facet joint medial branch blocks  2. Fluoroscopic guidance    Anesthesia: Local    Complication(s): None.    Estimated Blood Loss: Minimal.    Specimen(s): None sent.    Indications:  ??? At least 3 months of moderate to severe pain with functional impairment inadequately responsive to conservative care such as NSAIDs, acetaminophen, physical therapy (if tolerated).  ??? Predominant axial pain that is not associated with radiculopathy or neurogenic claudication.  ??? Absence of non-facet pathology that could explain the source of the patient???s pain, such as fracture, tumor, infection, or significant deformity.  ??? Clinical assessment that implicates the facet joint as the putative source of pain.     The following treatments have not been helpful in the past: Voltaren gel, Advil, methocarbamol, tramadol, physical therapy, and chiropractor.    There has been a significant functional impairment with the patient that has been impacting ADLs and quality of life in that they are unable to complete tasks without significant pain and disability.  They have missed days of work and family functions due to pain.     Pre-Procedure Pain:   8 / 10  Post-Procedure pain:  1 / 10    Procedure:   Patient had pre-operative COVID-19 testing which was negative on 03/19/2020.    Signed informed consent was obtained, and the risks, benefits, and alternatives were discussed with the patient to include but not limited to bleeding, infection, pain at the site of injection, minimal effectiveness of the procedure, nerve damage, weakness and numbness in the extremity, and worsening pain. The patient agrees with the following procedure.   The patient was brought into the operating room and draped and prepped in a normal fashion in the prone position. Blood pressure, pulse, oxygenation, saturation monitors were placed on the patient and the vital signs remained stable throughout the entire procedure.  Using anterior and posterior fluoroscopic views, the sacral ala on the left and right were identified. Area overlying this region was infiltrated with approximately 2 cc of 1% lidocaine each for local anesthesia. Then two 22-gauge 3.5 inch spinal needles were inserted and directed towards the sacral ala on each side until os was reached. Then a left oblique view was used to view the pedicle at L4 and L5  on the left. Area overlying this region was infiltrated with approximately 2 cc of 1% lidocaine each for local anesthesia. Two more 22-gauge 3.5 inch spinal needles were inserted and directed towards the junction of the superior articular process and transverse process of L4 and L5 until os was reached. Then a right oblique view was used to view the pedicle at L4 and L5 on the right. Area overlying this region was infiltrated with approximately 2 cc of 1% lidocaine each for local anesthesia. Then two more 22-gauge 3.5 inch spinal needles were inserted and directed towards the junction  of the superior articular process and transverse process of L4 and L5 until os was reached.     1 cc of contrast was injection and divided into each needle to ensure proper needle location and no vascular uptake.       At each level, 0.5 cc of 0.5% Marcaine was injected individually at each site. The needles were removed and the site was cleaned and dressed.     The patient was brought back into the Recovery Room without any complications or problems.    Plan:  1. Repeat injection can be done if successful improvement in pain.  2.  If successful of greater than 80% improvement, thus confirming the suspected diagnosis, the patient is a candidate for radiofrequency ablations.    The following findings confirm the medical necessity, indication, and reasonableness to perform medial branch blocks and if improvement of greater than 80% in pain relief, radiofrequency ablations:   A) pain is exacerbated by extension and rotation, or is associated with lumbar rigidity         noted on the physical examination done today.   B)  Pain has persisted despite appropriate conservative treatment (e.g., nonsteroidal         anti-inflammatory drugs (NSAIDs, exercise)    C) Clinical findings and imaging studies suggest no other obvious cause of the pain (e.g.,         spinal stenosis, disc degeneration or herniation, infection, tumor, fracture)

## 2020-03-21 ENCOUNTER — Inpatient Hospital Stay
Admit: 2020-03-21 | Discharge: 2020-03-21 | Disposition: A | Discharge status: Home / Self Care | Source: Home / Self Care | Attending: Orthopaedic Surgery of the Spine

## 2020-03-21 ENCOUNTER — Ambulatory Visit

## 2020-03-21 MED ADMIN — BUPIVACAINE HCL (PF) 0.5 % IJ SOLN: @ 15:00:00 | Stop: 2020-03-21 | NDC 55150016910

## 2020-03-21 MED ADMIN — LIDOCAINE HCL (PF) 1 % IJ SOLN: @ 15:00:00 | Stop: 2020-03-21 | NDC 63323049257

## 2020-03-27 ENCOUNTER — Telehealth

## 2020-03-27 NOTE — Telephone Encounter
[facility] NOTE - TELEPHONE CALLBACK     Paul Waller is a 54 y.o. male who is s/p Bilateral Lumbar L4-5 and L5-Sacral S1 Facet joint Medial Branch Blocks on 03/21/20. Discussed case with daughter who is patients point of contact.     Degree of pain relief after injection:    [x]  50% or >50% pain relief vs prior to injection- per daughter patient only had 50% relief for the few hours following injections. She was not aware that injections were just a test block and not meant for long term relief.     Adverse side effects or complication post injection:  [X]  No complications or adverse side effects reported       Plan:  Daughter will confirm with patient the percentage relief for only the few hours following injection if no significant improvment will follow in clinic for RV. If patient did get significant improvement in the hours following injection daughter will let our office know and we may trial a 2nd MBB.       , DO  PM&R Spine Fellow   Clinical Instructor   [facility] Department of Orthopaedic Surgery

## 2020-04-01 ENCOUNTER — Inpatient Hospital Stay: Payer: BLUE CROSS/BLUE SHIELD | Attending: Orthopaedic Surgery of the Spine

## 2020-04-01 ENCOUNTER — Ambulatory Visit: Attending: Orthopaedic Surgery of the Spine

## 2020-04-01 ENCOUNTER — Ambulatory Visit: Payer: BLUE CROSS/BLUE SHIELD

## 2020-04-01 ENCOUNTER — Ambulatory Visit

## 2020-04-01 ENCOUNTER — Ambulatory Visit: Payer: Worker's Compensation

## 2020-04-01 DIAGNOSIS — M5416 Radiculopathy, lumbar region: Secondary | ICD-10-CM

## 2020-04-01 DIAGNOSIS — M5126 Other intervertebral disc displacement, lumbar region: Secondary | ICD-10-CM

## 2020-04-01 DIAGNOSIS — G544 Lumbosacral root disorders, not elsewhere classified: Secondary | ICD-10-CM

## 2020-04-01 DIAGNOSIS — M47816 Spondylosis without myelopathy or radiculopathy, lumbar region: Secondary | ICD-10-CM

## 2020-04-01 DIAGNOSIS — M5137 Other intervertebral disc degeneration, lumbosacral region: Secondary | ICD-10-CM

## 2020-04-01 NOTE — Progress Notes
[facility] PROGRESS NOTE    Attending Physician: Yetta Flock, M.D.    Chief Complaint: back pain.    HPI:  Paul Waller is a 54 y.o. male who presents at the request of No ref. provider found for initial consultation and evaluation for treatment options related to the patient's pain complaints.   The patient was last seen on 03/19/2020 .  The patient is status post bilateral L45, L5S1 facet joint medial branch blocks on 03/21/20 without significant relief, 50% relief for a first hour or two.  Pt states that after the injection he had pain around the left PSIS with radiation to the left buttock. Pt states he also has similar symptoms occasionally on the right. Worse with prolonged standing, transitional movements, improves with walking, goes away with sitting.  Prev hx:  66yr hx of gradual onset back pain. The patient describes the pain as intermittent, Sharp, rated at 7/10 severity, located on the midline low back with radiation to the right buttock and into posterior thigh.  The pain is worsened by activity and sitting, and improved by medications.  The pain is not worse with sneezing/coughing/laughing. Pt states he occasionally stumbles as well, unsure if there is associated pain.    In the past 3 months, the patient has completed at least 6 weeks of conservative management including physical therapy, chiropractic care, and/or guided home exercise program.    The patient does not describe(s) any new weakness in the bilateral leg(s).  Patient reports no changes in bowel and bladder function.      ALLERGIES: Patient has no known allergies.    CURRENT MEDICATIONS:   Current Outpatient Medications   Medication Sig   ??? amLODIPine 10 mg tablet    ??? AMLODIPINE-ATORVASTATIN 10-20 mg tablet TAKE ONE TABLET BY MOUTH DAILY (Patient not taking: Reported on 12/25/2019)   ??? ergocalciferol 1250 mcg (50000 units) capsule    ??? meloxicam 15 mg tablet Take 15 mg by mouth daily.   ??? methylPREDNISolone 4 mg tablet pack Take as directed on package. (Patient not taking: Reported on 12/25/2019.)   ??? valsartan 80 mg tablet Take 1 tablet (80 mg total) by mouth daily.     No current facility-administered medications for this visit.       PAST MEDICAL HISTORY:   Past Medical History:   Diagnosis Date   ??? Colon polyp    ??? Diverticulosis    ??? High cholesterol    ??? HOH (hard of hearing)    ??? Hypertension    ??? Hypertensive retinopathy        PAST SURGICAL HISTORY:   Past Surgical History:   Procedure Laterality Date   ??? BACK SURGERY         SOCIAL HISTORY:   Social History     Socioeconomic History   ??? Marital status: Married     Spouse name: Not on file   ??? Number of children: Not on file   ??? Years of education: Not on file   ??? Highest education level: Not on file   Occupational History   ??? Not on file   Tobacco Use   ??? Smoking status: Never Smoker   ??? Smokeless tobacco: Never Used   ??? Tobacco comment: quit cigarette use 14 years ago for 20 yrs.   Substance and Sexual Activity   ??? Alcohol use: Yes     Comment: beer   ??? Drug use: Never   ??? Sexual activity: Not on file  Other Topics Concern   ??? Not on file   Social History Narrative   ??? Not on file     Social Determinants of Health     Physical Activity: Not on file   Stress: Not on file   Financial Resource Strain: Not on file       FAMILY HISTORY:   Family History   Problem Relation Age of Onset   ??? No Known Problems Mother    ??? Heart disease Father         s/p CABG and stents with procedures during age 15's.    ??? No Known Problems Sister    ??? No Known Problems Brother    ??? No Known Problems Maternal Aunt    ??? No Known Problems Maternal Uncle    ??? No Known Problems Paternal Aunt    ??? No Known Problems Paternal Uncle    ??? No Known Problems Maternal Grandmother    ??? No Known Problems Maternal Grandfather    ??? No Known Problems Paternal Grandmother    ??? No Known Problems Paternal Grandfather    ??? Celiac disease Neg Hx    ??? Cirrhosis Neg Hx    ??? Colon cancer Neg Hx    ??? Colon polyps Neg Hx    ??? Crohn's disease Neg Hx    ??? Esophageal cancer Neg Hx    ??? Inflammatory bowel disease Neg Hx    ??? Irritable bowel syndrome Neg Hx    ??? Liver cancer Neg Hx    ??? Liver disease Neg Hx    ??? Rectal cancer Neg Hx    ??? Stomach cancer Neg Hx    ??? Ulcerative colitis Neg Hx    ??? Colitis Neg Hx    ??? Microscopic Colitis Neg Hx    ??? Cardiorespiratory Failure Neg Hx    ??? Cardiopulmonary Failure Neg Hx    ??? Familial Mediterranean Fever Neg Hx    ??? Temporomandibular Joint Disorder Neg Hx          Review of Systems:  Consititutional:    Fevers - no   Weight change - no  Eyes:    Vision change - no  Ears, Nose, Mouth, Throat:   Headaches - no  Cardiovascular:   Chest pain - no  Respiratory:   Shortness of breath - no  Gastrointestinal:   Stool incontinence - no  Genitourinary:   Urinary incontinence - no  Integumentary:   Rashes - no  Neurological:   Weakness - No   Numbness/tingling - No  Psychiatric:   Depressed mood - no   Sleep problems - no   Anxiety - no  Musculoskeletal:   Other joint swelling - no    PHYSICAL EXAMINATION:    Vital Signs:  R-12 There were no vitals taken for this visit.    General: well developed, well nourished, and in no acute distress, alert and oriented x 4.  Cardiovascular/Extremities: Pulse intact distally with no cyanosis, clubbing, or edema.  Respiratory: Normal work of breathing without apnea and no evidence of respiratory distress without use of accessory muscles.  Abd: soft. No tenderness to palpation  Skin: no lesions or rash on trunk, feet, or hands  Lymphatic: No enlarged cervical or inguinal lymph nodes.    Joint examination: functional range of motion for all four extremities at the shoulders, elbows, wrist, hips, knees, and ankles is normal,      Spine:  Lumbar-spine: The lumbar spine is symmetric  without kyphosis or scoliosis.   Range of motion is normal in all planes and if limited range is due to pain.  There is not tenderness to palpation in the bilateral lumbar paraspinals.  Positive bilateral FACET  Negative bilateral both straight leg raise and slump test  Positive left FF test.    Neurological: Normal gait. Sensation is intact to light touch in the upper and lower extremities. Motor exam shows 5/5 for upper and lower extremity muscles tested. Reflexes are symmetric at 2+ for the upper and lower extremities. Muscle tone is normal with no clonus or muscle atrophy. Negative Babinski's. Hoffman's negative.     Sacroiliac joint testing: Patient had   +Right Gaenslen???s test   negative bilateral thigh thrust  Positive left FABER Paul Waller test),              Imaging and work-up:  Imaging was reviewed personally by me and demonstrate by reporting the following:  MRI L spine 10/2019  IMPRESSION:  At L5-S1 there is severe loss of disc height with a 3 mm retrolisthesis and a 4 mm right paracentral and lateral recess osteophyte which mildly effaces the right S1 nerve root within the right lateral recess. Disc bulge with osteophyte loss of disc height moderate to severely narrows the right neural foramen. Left-sided disc bulge and facet hypertrophy moderate to severely narrows the left neural foramen. Findings suggest a left hemilaminectomy although there is no history of surgery in this could represent a spina bifida occulta.  ???  At L3-4 there is a 3 to 5 mm left lateral recess and foraminal protrusion which mildly narrows the left lateral recess displacing the left L4 nerve root and slightly displaces the exiting left L3 nerve root within the left neural foramen and far   laterally  ???  At L4-5 disc bulge mild to moderately narrows the right neural foramen with facet hypertrophy  ???         Assessment and Plan:   Commentary and Medical Decision Making:    Paul Waller is a 54 y.o. male who presents to clinic today for evaluation for back pain.  Based on the history, physical examination and evaluation of all available imaging, I have made the following assessment.    Assessment:  1. Lumbar facet pain  2. Lumbosacral nerve root disorder  3. Bilateral Sacroiliac Joint dysfunction  4. Lumbar radiculitis in a left L3, versus L4 or L5 distribution.  5. Lumbar Disc Herniation  6. Lumbar Strain  7. Lumbar degenerative disk disease  8. Myofascial pain    Plan:   1. Medication(s) as noted below. Primary provider may renew medication, and the patient was advised of side effects and understood and agreed to take the medication as directed.    -Continue current pain meds.    2. Continue HEP    3. For diagnostic and therapeutic purposes, consider:    1. b/l L3/4 TFESI targeted at the pathology seen at that level. Wait until MRI is completed    1. Pt got 50% relief with first MBB, pain likely multifactorial, consider repeat bilateral L3, L4 medial branch and L5 dorsal rami blocks under fluoroscopy. If successful the patient would be a candidate for radiofrequency ablation procedure. Local.    2. If inadequate relief with above, consider PNS to lumbar multifidi. Also discussed SCS with pt.    3. status post b/l SIJ injections on 01/18/20 without significant relief, helped for ~12 hours the first day, then the pain came back.  4. Recommended pt to follow up again with Dr. [name]    5. Ordered MRI L-spine to look for progressive pathology considering pt's pain has been refractory to many treatments thus far    6. Follow-up with primary care provider for other medical issues and for further management or medication refills and therapy renewals. The patient can follow-up if they would like any of the options listed above.    30 minutes were spent personally by me today on this encounter which may include today???s pre-visit review of the chart, time spent during the visit, and  today???s time spent  after the visit documenting and coordinating care.    I advised the patient that if neurological issues were to develop such as weakness, bowel or bladder control, or worsening pain, that they should present immediately to the closest Emergency Room.    Thank you for this consultation; If I can be of further service, please contact my office at  [phone].         I have personally evaluated and examined the patient in detail. I agree with the findings, diagnosis, and treatment plans listed above. I formulated the plan and recommendations, and I reviewed this with the patient. I have modified the note above to reflect my personal exam findings and impressions, as needed.     Yetta Flock, M.D.

## 2020-04-02 ENCOUNTER — Ambulatory Visit: Payer: Worker's Compensation

## 2020-04-03 ENCOUNTER — Ambulatory Visit

## 2020-04-04 ENCOUNTER — Ambulatory Visit

## 2020-04-07 NOTE — Op Note
Date of Operation:  04/10/20    Pre-operative Diagnosis:   1. Lumbar radiculopathy  2. Degenerative disc disease, lumbar spine  3. Pre-Op Diagnosis Codes:     * Lumbar radiculopathy [M54.16]    Post-operative Diagnosis:   1. Lumbar radiculopathy  2. Degenerative disc disease, lumbar spine  3. Pre-Op Diagnosis Codes:     * Lumbar radiculopathy [M54.16]    Surgeon: Yetta Flock, MD  I am the attending surgeon on this case. I was present for all key and critical portions of this case    Operation Title:  1. Left L3-L4 transforaminal epidural steroid injection.  2. Fluoroscopic guidance    Anesthesia: Local    Complication(s): None.    Estimated Blood Loss: Minimal.    Specimen(s): None sent.    Procedure:  Patient had pre-operative COVID-19 testing which was negative on 04/08/20.    Signed informed consent was obtained, and the risks, benefits, and alternatives were discussed with the patient to include but not limited to bleeding, infection, pain at the site of injection, minimal effectiveness of the procedure, nerve damage, weakness and numbness in the extremity, and worsening pain. The patient agrees with the following procedure.     The patient was brought into the operating room and draped and prepped in a normal fashion in the prone position. Blood pressure, pulse, oxygenation, saturation monitors were placed on the patient and the vital signs remained stable throughout the entire procedure.     Using anterior and posterior fluoroscopic views, the  L3 vertebral body was identified.  A slight left oblique view was used to visualize the pedicle.  Area underlying this region was infiltrated with approximately 2 cc of 1% lidocaine for local anesthesia.  A 22-gauge 3.5-inch spinal needle was then inserted and directed towards the 6 o'clock position of the pedicle using intermittent fluoroscopic guidance.  Final needle placement was made over the neuroforamen using a lateral view.  There was negative cerebrospinal fluid and negative heme aspiration.  1 cc of contrast dye confirmed epidural uptake and outlining of the nerve root.  Then, a solution containing  15 mg dexamethasone and 0.5 cc preservative free 1% lidocaine was injected. The needle was removed and the site was cleaned and dressed, and the patient was brought back into the recovery room without any complications or problems.    PLAN:   The patient can follow up to repeat the injection.

## 2020-04-15 ENCOUNTER — Ambulatory Visit: Attending: Orthopaedic Surgery of the Spine

## 2020-04-22 ENCOUNTER — Ambulatory Visit

## 2020-04-22 DIAGNOSIS — M5136 Other intervertebral disc degeneration, lumbar region: Secondary | ICD-10-CM

## 2020-04-22 DIAGNOSIS — S39012A Strain of muscle, fascia and tendon of lower back, initial encounter: Secondary | ICD-10-CM

## 2020-04-22 NOTE — Progress Notes
Date:  04/22/2020  Name:  Paul Waller  ACCT:#:  [id number]  DOB:   1965/10/19  Age:   54 y.o.        [name]. Charlean Merl, MD    The patient was seen in our [place] office.     CHIEF COMPLAINTS:  Low back pain    HISTORY OF PRESENT ILLNESS:  The patient is a 54 y.o. male who was last seen in my office on November 28, 2019 where he presented with a chronic history of low back pain.  He reported that the pain is mainly located across the lower back.  He reports the pain is a 7/10 intensity.  He denies any specific history of trauma.  He reports the pain is better with Aleve and ice.  He has a past medical history significant for hypertension.  He is currently taking Mobic as needed for the pain.  He does report some intermittent pain that radiates his right thigh.  He denies any significant weakness or numbness in the bilateral lower extremities.  He was diagnosed with severe degenerative disc disease at L4-S1.  He was started on a course of physical therapy.  He reports the physical therapy has failed to give him any pain relief.  He has also undergone a series of epidural injections which have also failed to give him significant pain.  He continues to complain of severe low back pain that is significantly limiting his quality of life.  He does complain of some intermittent right greater than left thigh a posterior buttock pain that also causes dysfunction.  He reports that activities requiring excessive walking and lifting are very difficult.  A discussion regarding further treatment options including facet injections and a possible ablation procedure were discussed at the last visit.  Surgical treatment including an anterior lumbar interbody fusion from L4-S1 with posterior spinal instrumented fusion was also discussed.  He presents today for repeat evaluation.  He reports that he did undergo a facet injection which failed to give him significant pain relief.  He continues to complain predominantly of low back pain.  He denies any significant leg pain.    Patient Medical History: The patient???s intake sheet, including past medical history, past surgical history, medicines, allergies, social and family history was reviewed by myself and the patient today, these are noted and documented in the patient???s file.    REVIEW OF SYSTEMS: The review of systems as documented today in the medical record is remarkable for the positive orthopedic problems discussed above and is otherwise non-contributory with respect to Constitutional, ENT, Cardiovascular, GU, Skin, Neurologic, Endocrine, Hematologic, Psychiatric, Gastrointestinal, Respiratory, Eyes and Allergic/Immunologic systems except as noted on the intake form.      OBJECTIVE FINDINGS:     Vitals: There were no vitals taken for this visit.    GENERAL: The patient is a well-developed, well nourished male in no acute distress.   HEENT: Normocephalic, atraumatic. Pupils equally round, reactive to light. Extraocular motion is intact.   NECK: Supple without lymphadenopathy. Trachea midline.   RESPIRATIONS: Regular and unlabored without abnormal intercostal retractions or abnormal diaphragmatic movement.   CARDIOVASCULAR: No cyanosis, clubbing, or edema.   SKIN: Without lesions, masses, or rash.   PSYCHIATRIC: Normal affect, insight, and mood.     PHYSICAL EXAMINATION:   LUMBAR SPINE EXAMINATION:    Gait and Station  The patient arises from seated to standing without difficulty. The patient stands with level shoulders and pelvis.    Normal lumbar lordosis and  thoracic kyphosis. Gait is normal without limp or weakness. Toe and heel walking ar without observed deficit.     Scars/Masses  The skin is normal without lesions or masses.      Range Of Motion   % of Normal   Back  Pain (+/-)  Leg Pain (+/-)    Lumbar Flexion  Moderately decreased   +    -  Lumbar Extension  Moderately decreased   +    -  Right Lateral Flexion Moderately decreased   +    -  Left Lateral Flexion  Moderately decreased   +    -    Motor Strength        Right Left  Hip Flexors    5  5   Quadriceps    5  5   Tibialis Anterior   5  5   Extensor Hallucis Longus 5  5   Ankle Plantarflexors  5  5        Sensation  Light touch sensation is intact in both lower extremities.    Reflexes     Right Left  Patellar       2  2   Achilles       2  2     FABER     Negative Negative  Hip Range of Motion   Normal  Normal   Seated Straight Leg Raise Negative Negative  Supine Straight Leg Raise Negative Negative    Tenderness   Lumbosacral midline   None  Paralumbar muscles   None   Right Sciatic Notch   None  Left Sciatic Notch   None     Spasm in paraspinal muscles  Absent  Leg Lengths    Equal  Thigh Circumference   Equal  Calf Circumference   Equal     RADIOGRAPHS: Taken at our [facility] office and reviewed by me show: 2 Views of Lumbar Spine: There are five lumbar vertebrae. There is moderate loss of normal lumbar lordosis. Coronal alignment reveals asymmetric disc space collapse at L4-5 (right > than left). Vertebral body heights are within normal limits. Disc heights are severely decreased at L4-5 and L5-S1 There is no evidence of spondylolysis or spondylolisthesis.    MRI of the lumbar spine performed on November 12, 2019 was reviewed.  IMPRESSION:  At L5-S1 there is severe loss of disc height with a 3 mm retrolisthesis and a 4 mm right paracentral and lateral recess osteophyte which mildly effaces the right S1 nerve root within the right lateral recess. Disc bulge with osteophyte loss of disc   height moderate to severely narrows the right neural foramen. Left-sided disc bulge and facet hypertrophy moderate to severely narrows the left neural foramen. Findings suggest a left hemilaminectomy although there is no history of surgery in this could   represent a spina bifida occulta.  ???  At L3-4 there is a 3 to 5 mm left lateral recess and foraminal protrusion which mildly narrows the left lateral recess displacing the left L4 nerve root and slightly displaces the exiting left L3 nerve root within the left neural foramen and far   laterally  ???  At L4-5 disc bulge mild to moderately narrows the right neural foramen with facet hypertrophy      DIAGNOSES:  1. Severe lumbar degenerative disc disease L4-S1 with foraminal narrowing and radiculopathy with discogenic and facet mediated back pain    DISCUSSION: A thorough discussion regarding the patient???s diagnosis was discussed  in detail.  All of the patient???s questions were answered prior to the conclusion of this visit.  He presents with a several year history of significant low back pain in the setting of severe degenerative disc disease from L4-S1.  He has pain with both lumbar flexion and extension.  He has failed to see significant pain relief with a course of conservative treatment including a series of epidural injections and physical therapy.  Given his persistent pain and symptoms, further treatment options were discussed today.  Further treatment options discussed for his condition included referral to Pain Management for a spinal cord stimulator trial.  Other treatment options discussed included an anterior lumbar interbody fusion at L4-5 and L5-S1 with posterior spinal instrumented fusion as a last resort.  A handout regarding this procedure was given to the patient.  If he would like to undergo surgical treatment for his condition, he will contact my office for repeat evaluation.    ''Disclaimer: This document was generated using a voice recognition system, which may produce sporadic inaccurate transcription or nonsensical phrases.''

## 2020-04-23 ENCOUNTER — Ambulatory Visit: Payer: Worker's Compensation

## 2021-03-10 IMAGING — DX KNEE 3 VIEWS LEFT
1 series · 3 of 3 positions shown · non-contrast
Comparison: None

________________________________________________________________________________________________ 
KNEE 3 VIEWS LEFT, 03/10/2021 [DATE]: 
CLINICAL INDICATION:  Pain after applicable, joint effusion

[Series 1: ap int rot · U · 0.14mm/px · 3 of 3 slices shown]
[im 1/3]
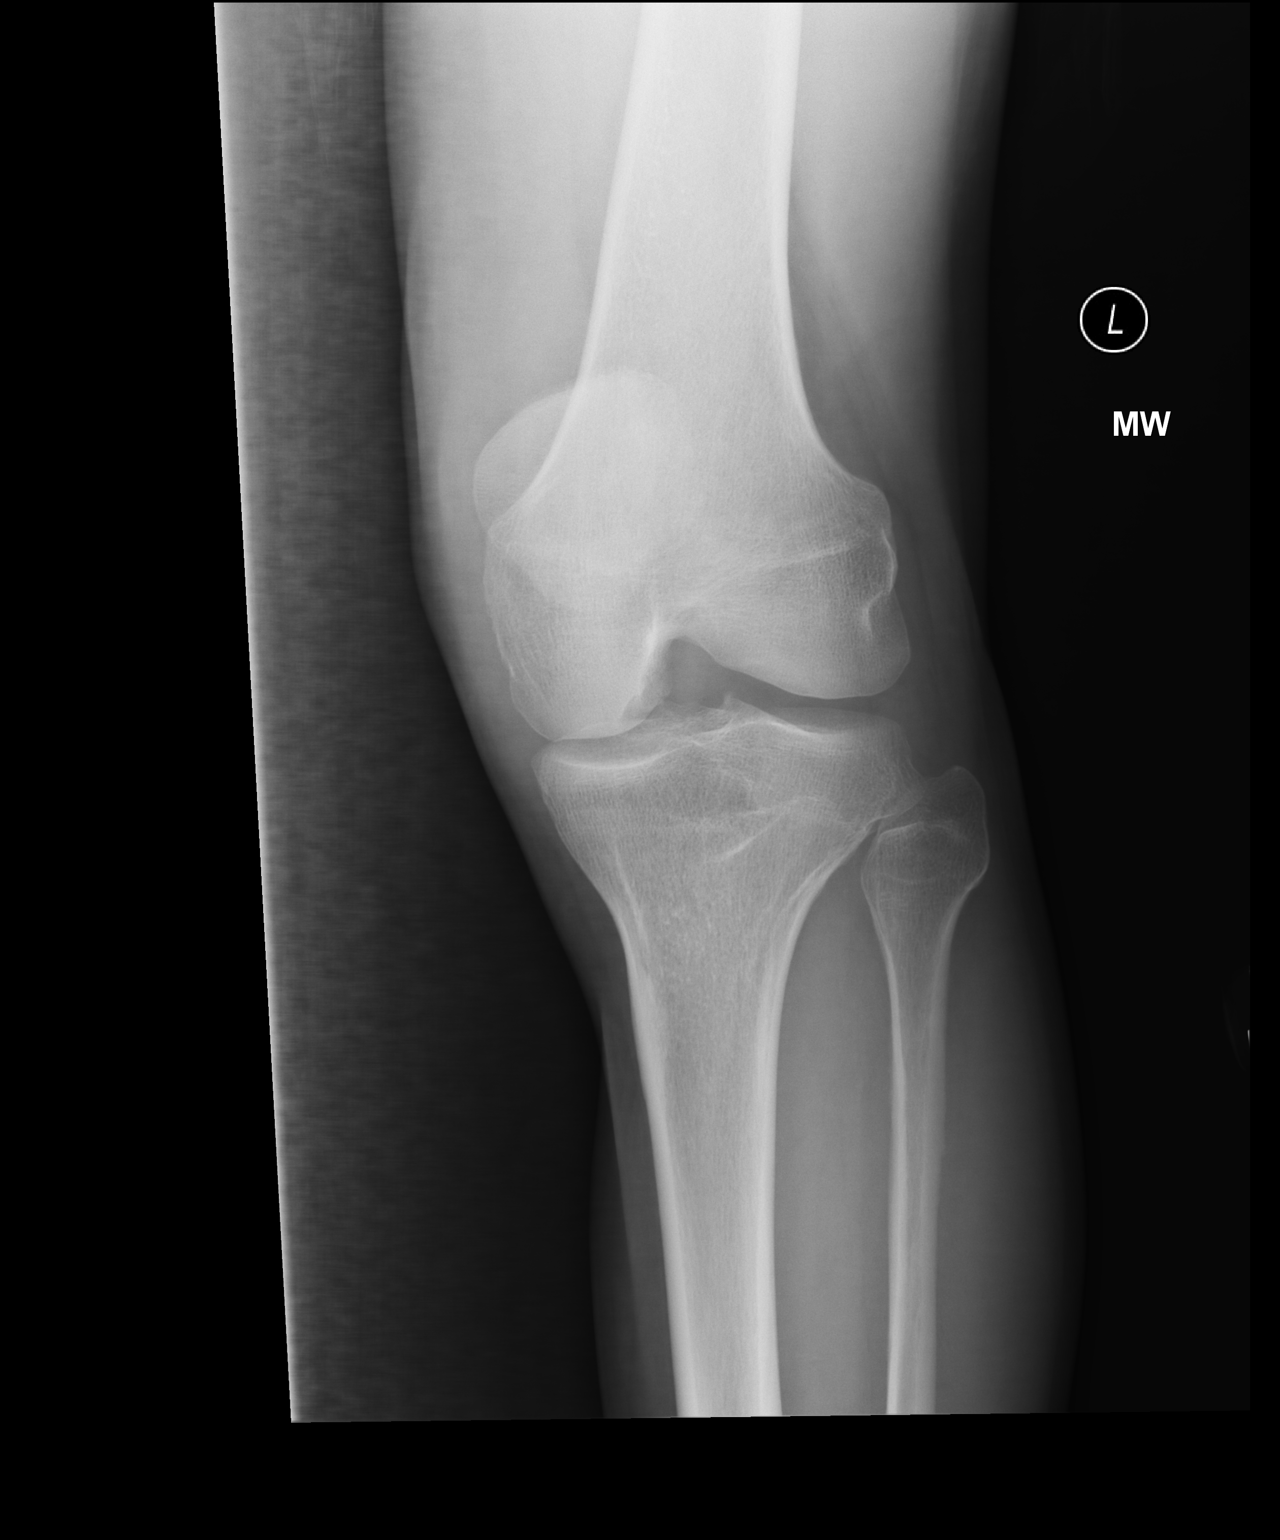
[im 2/3]
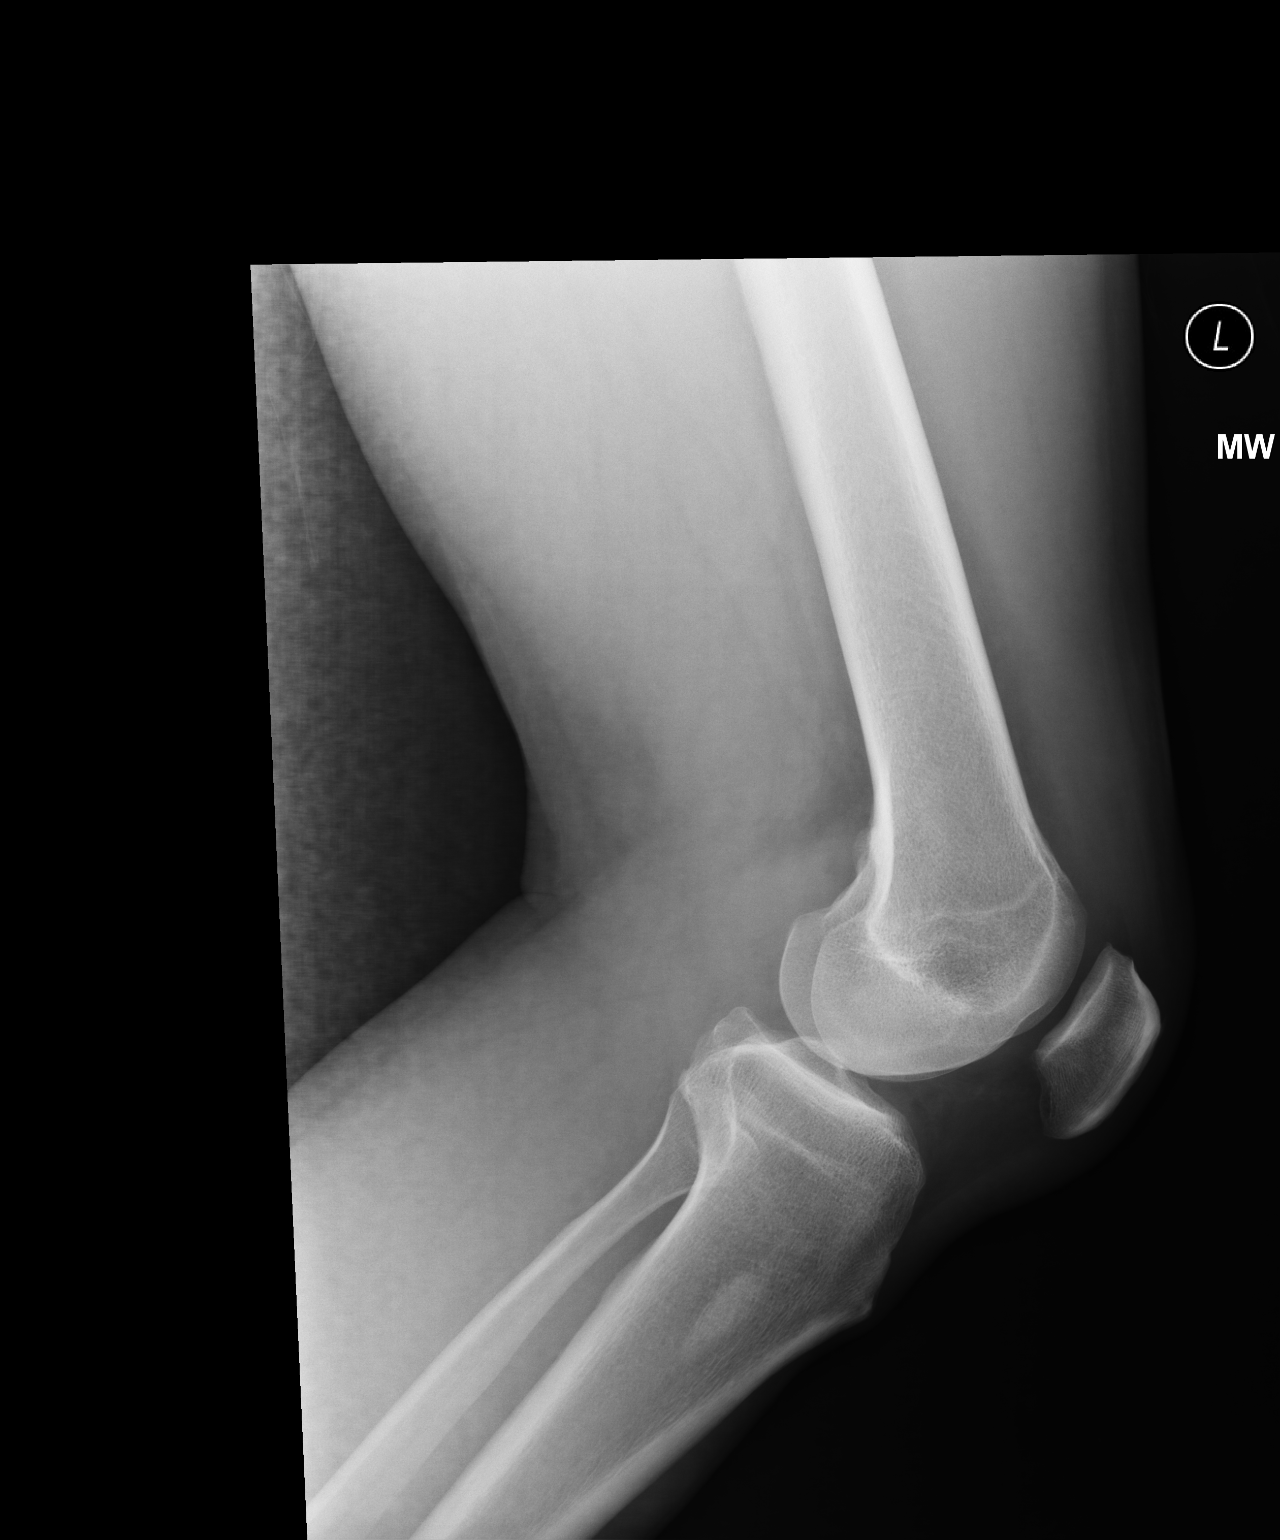
[im 3/3]
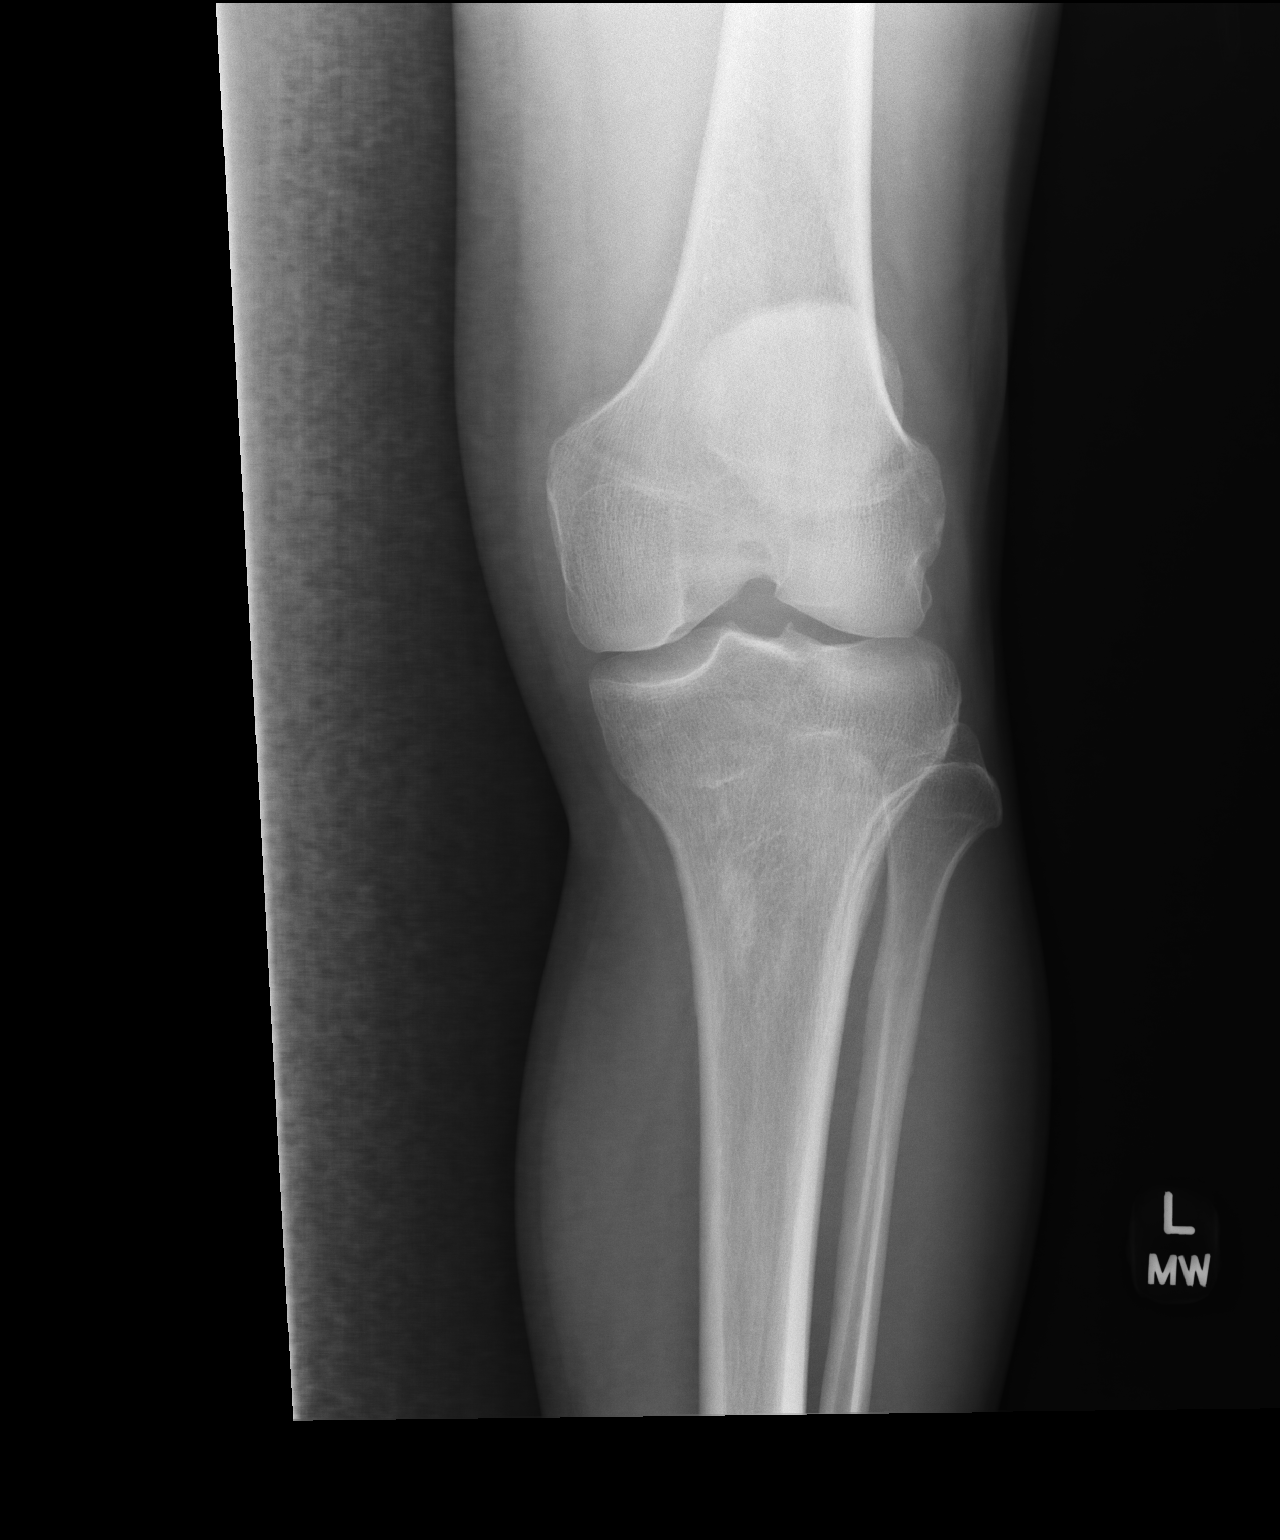

[3 of 3 positions shown; findings below may reference images not displayed]

FINDINGS: There are mild osteoarthritic changes involving the patellofemoral 
and medial tibiofemoral articulations. No fracture, dislocation or bone 
destruction. There does not appear to be a significant joint effusion.
IMPRESSION: Mild osteoarthritic changes. MRI of the knee would be useful for further 
evaluation if clinically warranted..

## 2022-10-26 IMAGING — MR MRI BRAIN W/WO CONTRAST
2 of 17 series · 5 of 48 positions shown · IV contrast (gadavist)
Comparison: None

________________________________________________________________________________________________ 
MRI BRAIN W/WO CONTRAST, 10/26/2022 [DATE]: 
CLINICAL INDICATION: Blurred left vision. Previous lumbar surgery. History of 
melanoma.
TECHNIQUE: Multiplanar, multiecho position MR images of the brain were performed 
without and with 9 mL of Gadavist were injected intravenously by hand. 1 mL was 
discarded. Patient was scanned on a 1.5T magnet.

[Series 1002: T1 post-contrast · coronal · 1.0mm · 0.24mm/px · 3 of 180 slices shown (1 of 2)]
[im 30/180]
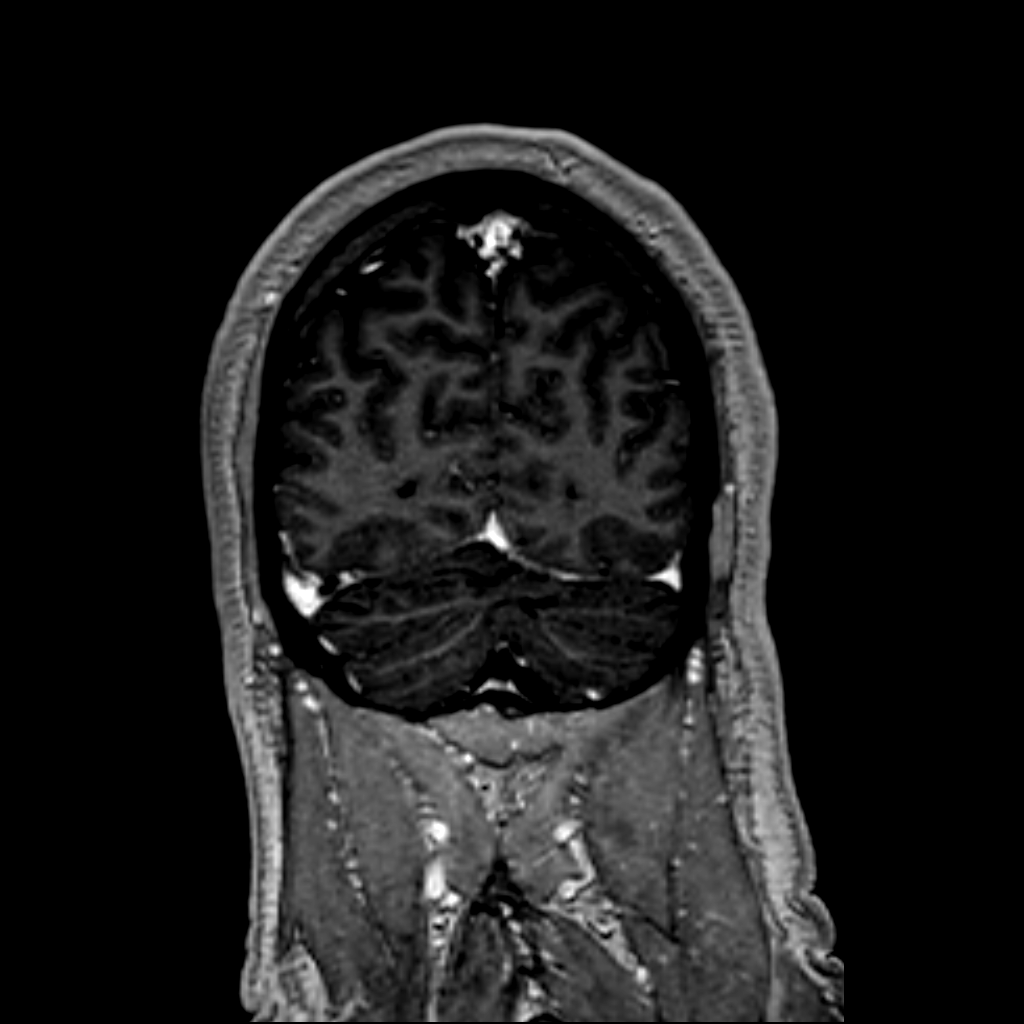
[im 90/180]
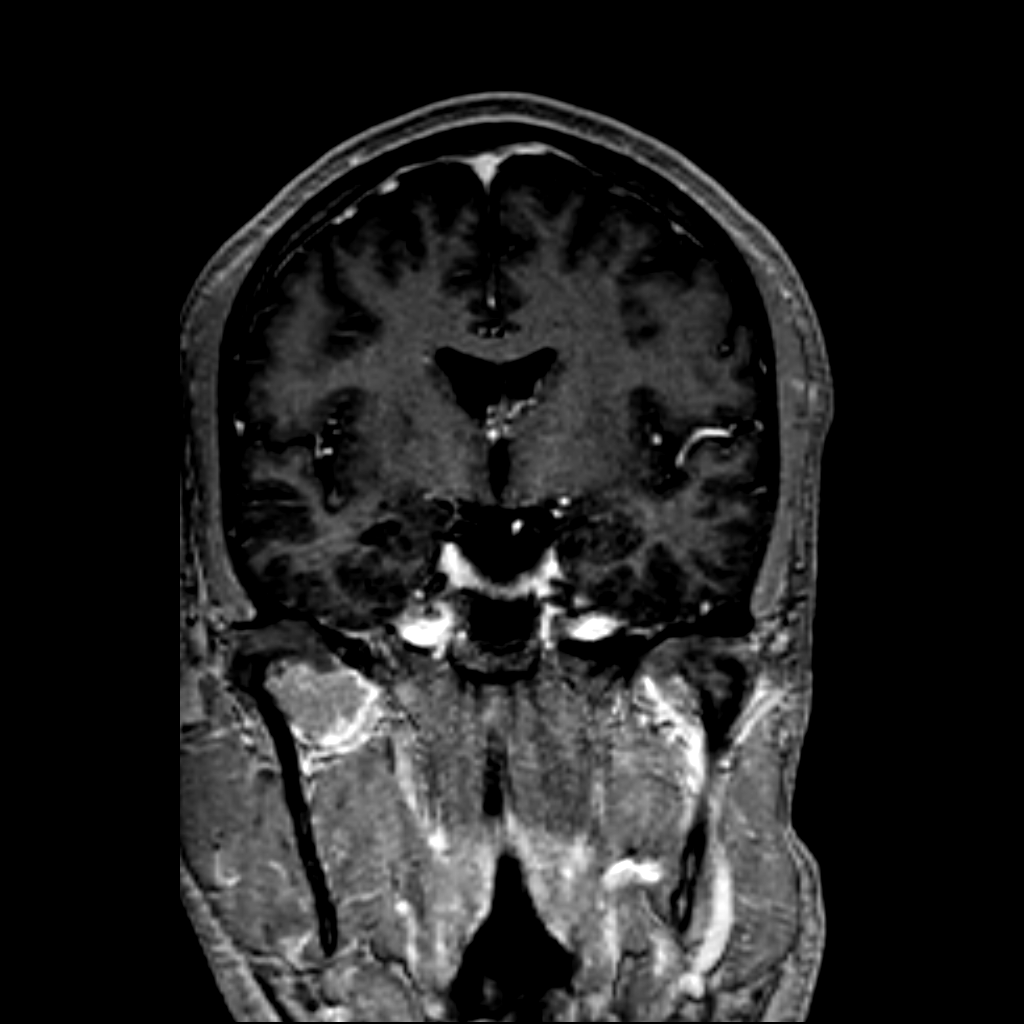
[im 150/180]
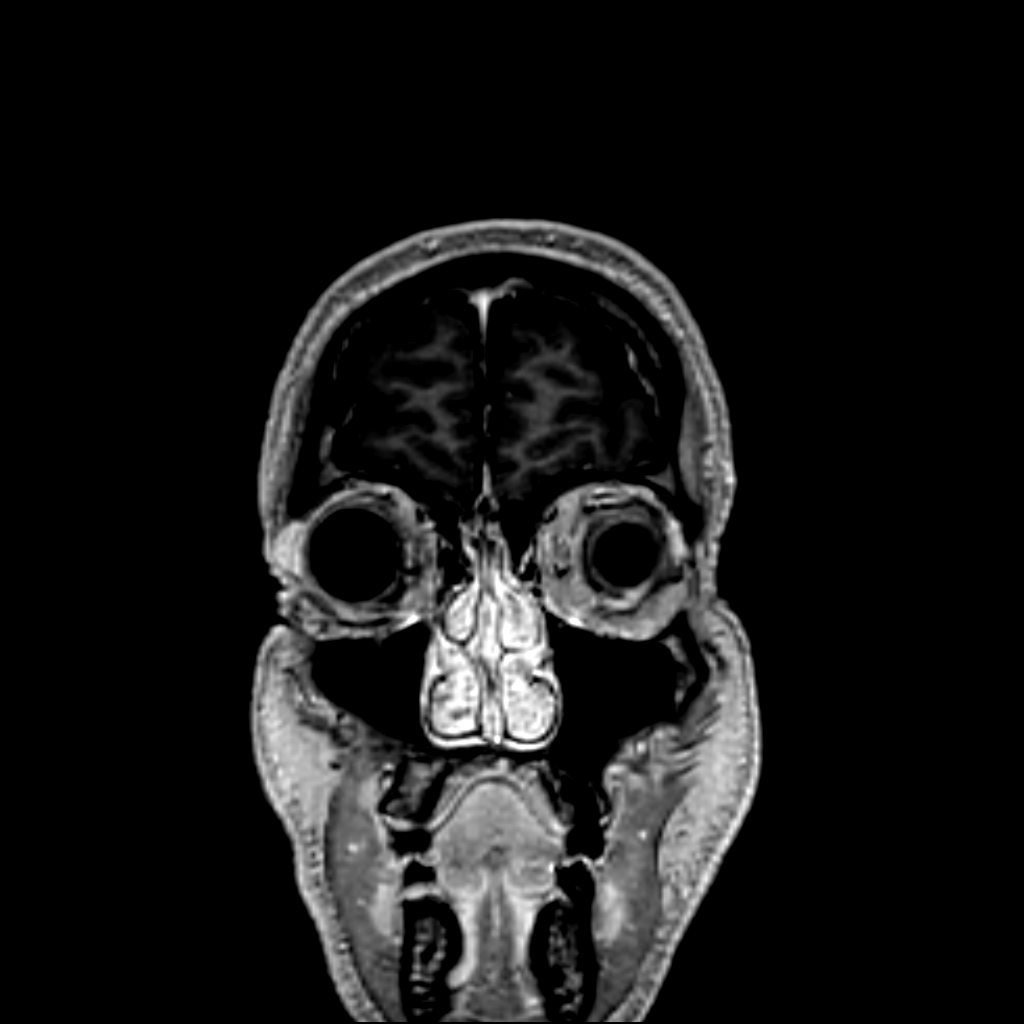

[Series 1003: T1 post-contrast · axial · 1.0mm · 0.24mm/px · z∈[-32,+24]mm · 2 of 170 slices shown (2 of 2)]
[im 29/170]
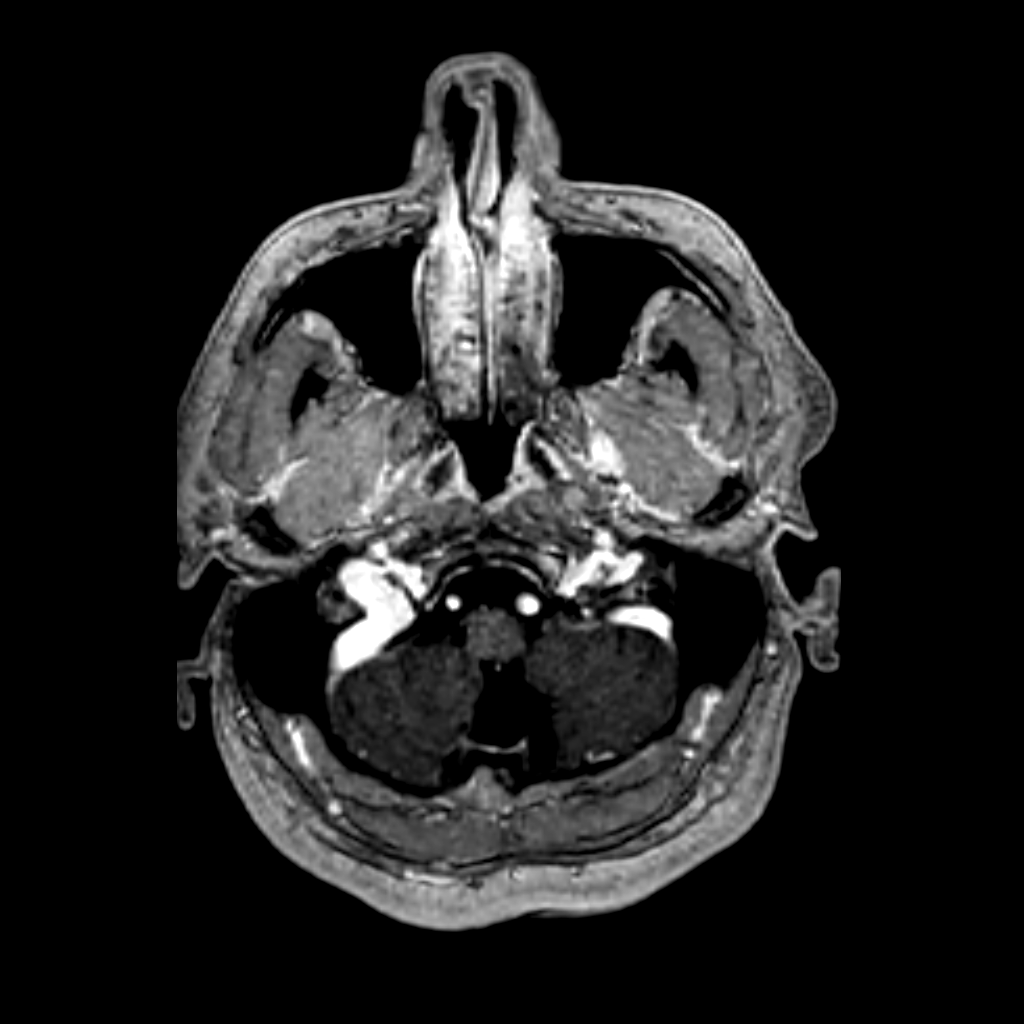
[im 85/170]
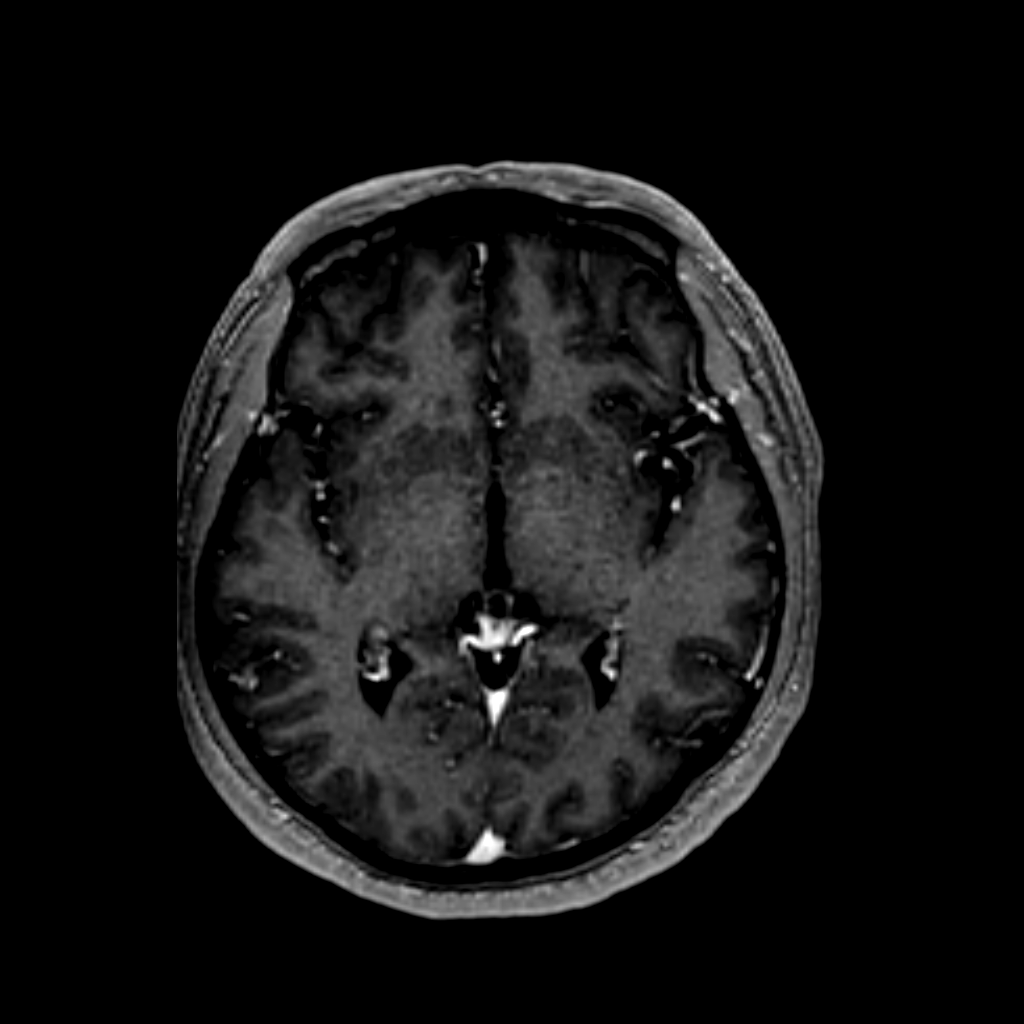

[5 of 48 positions shown; findings below may reference images not displayed]

FINDINGS: -------------------------------------------------------------------------------- 
------------------------- 
INTRACRANIAL: 
Minimal nonspecific periventricular, subcortical, and deep white matter T2 FLAIR 
hyperintensity is most likely related to chronic microangiopathy. No mass or 
abnormal extra-axial fluid collection. 
No acute ischemia. No abnormal foci of susceptibility artifact in the brain. 
Patency of the intracranial vascular flow voids.  No acute intracranial 
hemorrhage, mass effect, midline shift. No large sellar mass. No hydrocephalus. 
Cerebral volume is age appropriate.  No pathologic contrast enhancement.  
-------------------------------------------------------------------------------- 
----------------------- 
OTHER: 
ORBITS/SINUSES/T-BONES:  No acute abnormality. There is suggestion for mild 
bilateral proptosis.  Mastoid air cells and middle ear cavities are grossly 
clear.  Visualized paranasal sinuses are clear. 
MARROW SIGNAL/SOFT TISSUES: No focal suspect signal abnormality. Bilateral neck 
lymph nodes are incompletely visualized. 
-------------------------------------------------------------------------------- 
-------------------
IMPRESSION: No acute intracranial process. Mild chronic appearing brain parenchymal changes 
are likely reflective of chronic microangiopathy. 
Suggestion for mild bilateral proptosis. No orbital mass is identified. Could 
consider further evaluation with dedicated MRI orbits as clinically indicated.

## 2022-10-26 IMAGING — MR MRI LUMBAR SPINE WITHOUT CONTRAST
5 of 8 series · 12 of 48 positions shown · IV contrast (gadolinium)
Comparison: None

________________________________________________________________________________________________ 
MRI LUMBAR SPINE WITHOUT CONTRAST, 10/26/2022 [DATE]: 
CLINICAL INDICATION: Low back pain extending to both legs
TECHNIQUE: Multiplanar, multiecho position MR images of the lumbar spine were 
performed without intravenous gadolinium enhancement. Patient was scanned on a 
1.5T magnet

[Series 101: survey · axial · 10.0mm · 1.25mm/px · 1 of 10 slices shown]
[im 1/10]
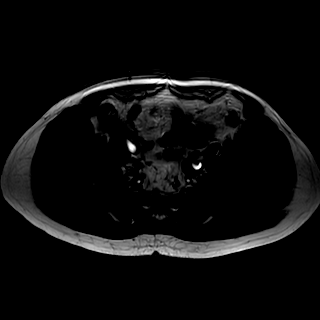

[Series 201: t2w_cor-surv · coronal · 6.0mm · 0.62mm/px · 2 of 14 slices shown]
[im 1/14]
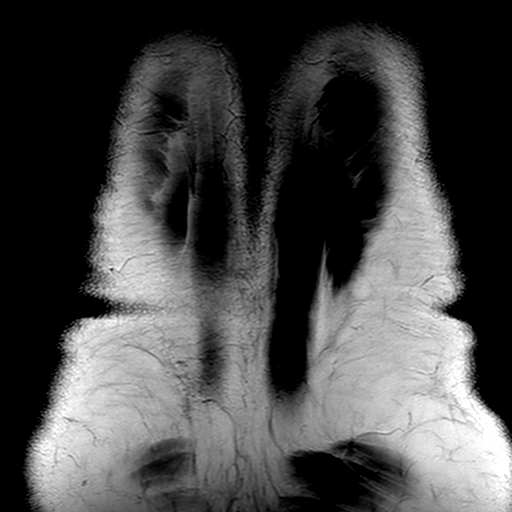
[im 14/14]
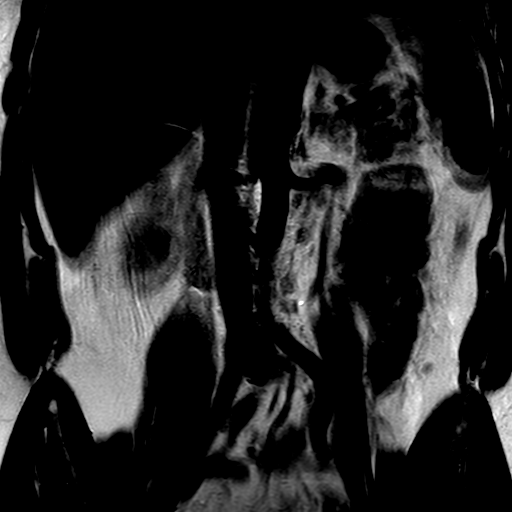

[Series 301: t1_tse_sag · sagittal · 4.0mm · 0.36mm/px · 3 of 19 slices shown]
[im 1/19]
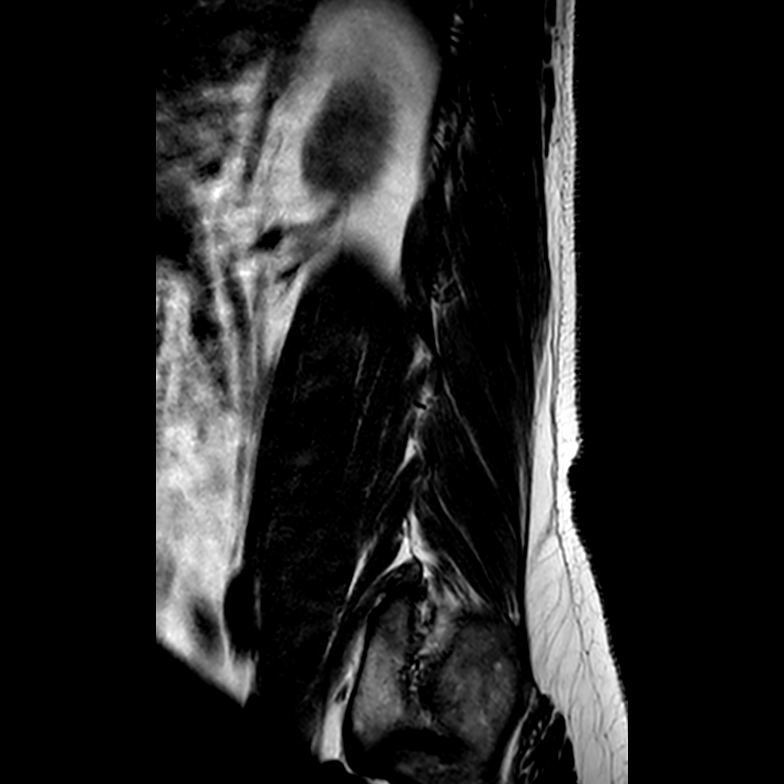
[im 10/19]
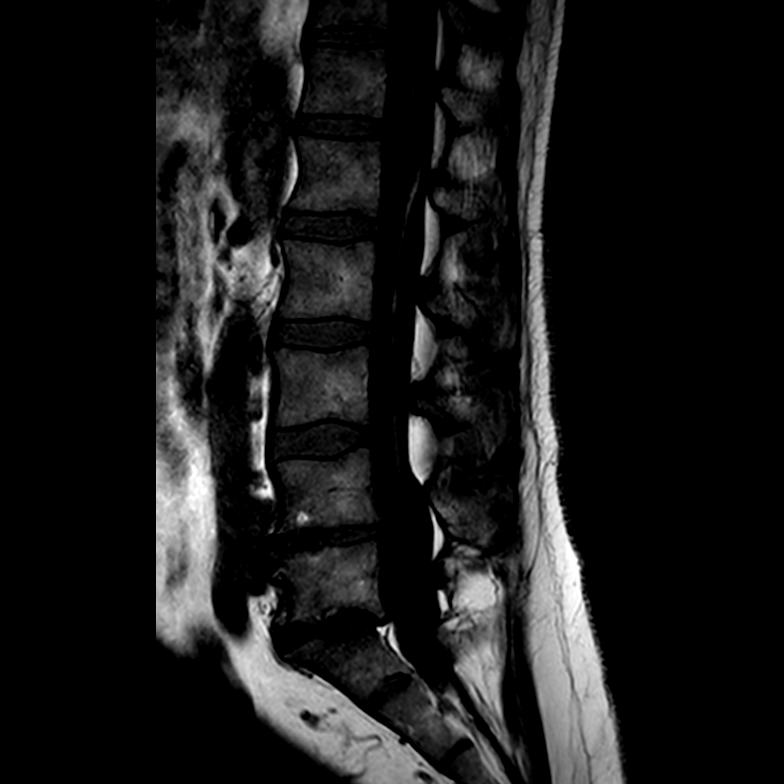
[im 19/19]
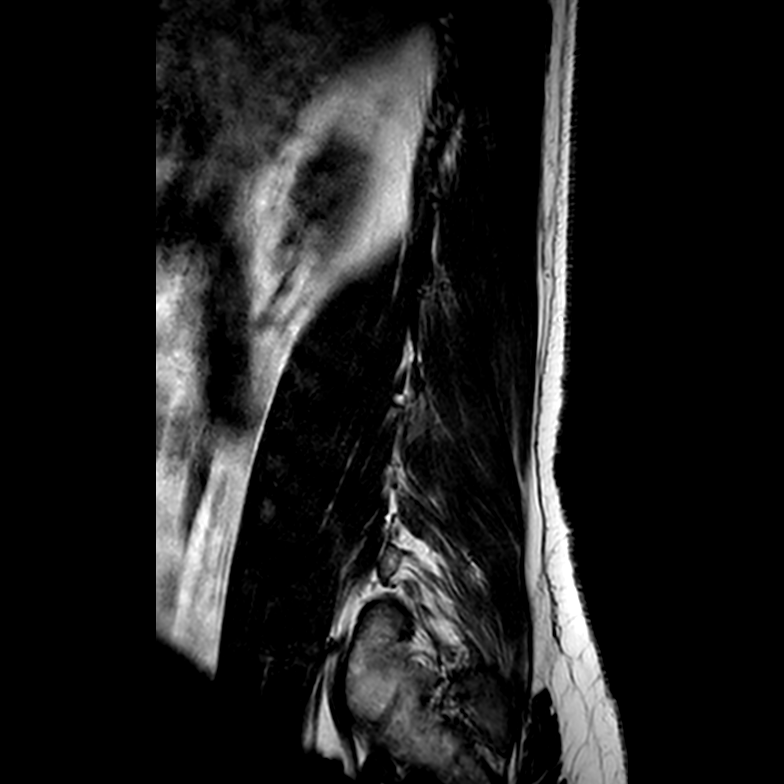

[Series 402: (id)_mdixon_tse · sagittal · 4.0mm · 0.42mm/px · 3 of 19 slices shown]
[im 1/19]
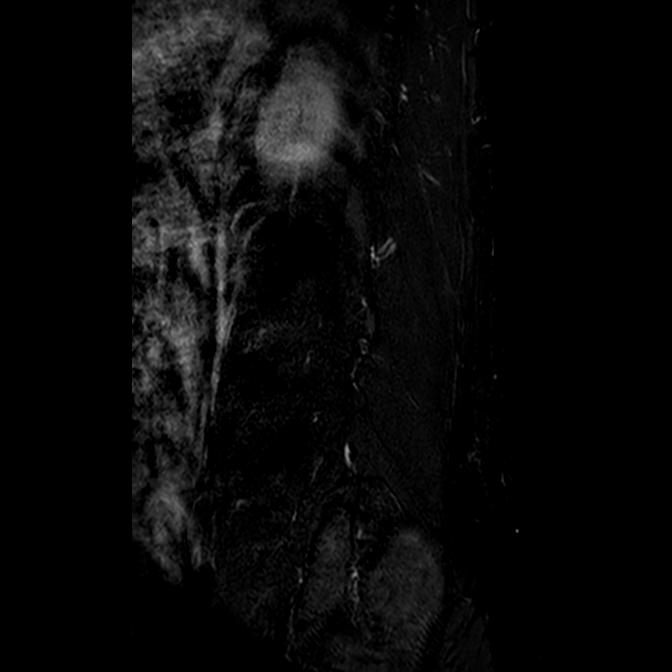
[im 10/19]
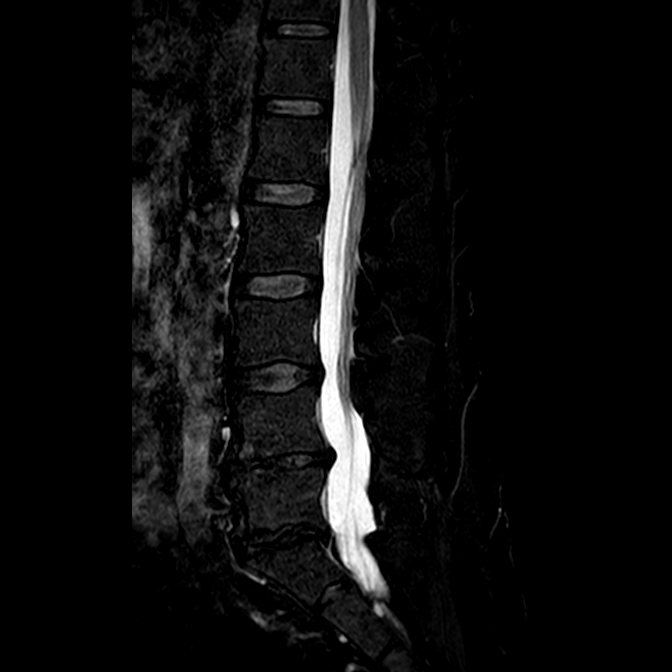
[im 19/19]
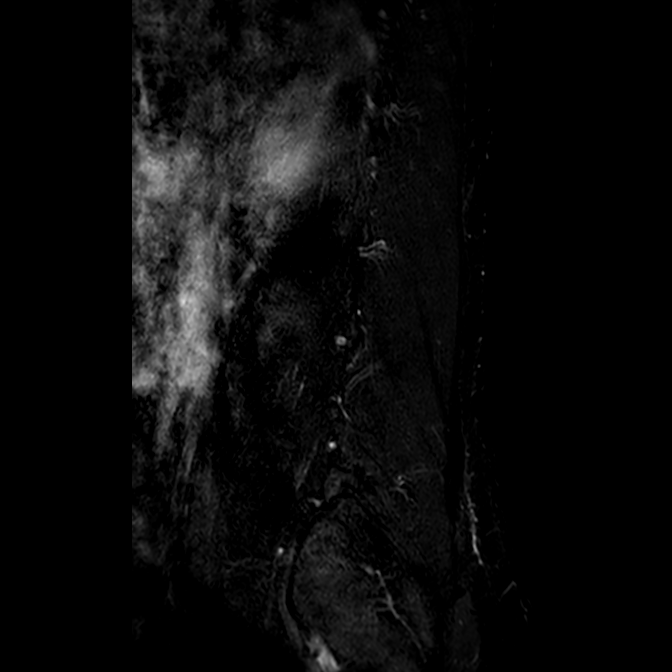

[Series 403: st2w_mdixon_tse · sagittal · 4.0mm · 0.42mm/px · 3 of 19 slices shown]
[im 1/19]
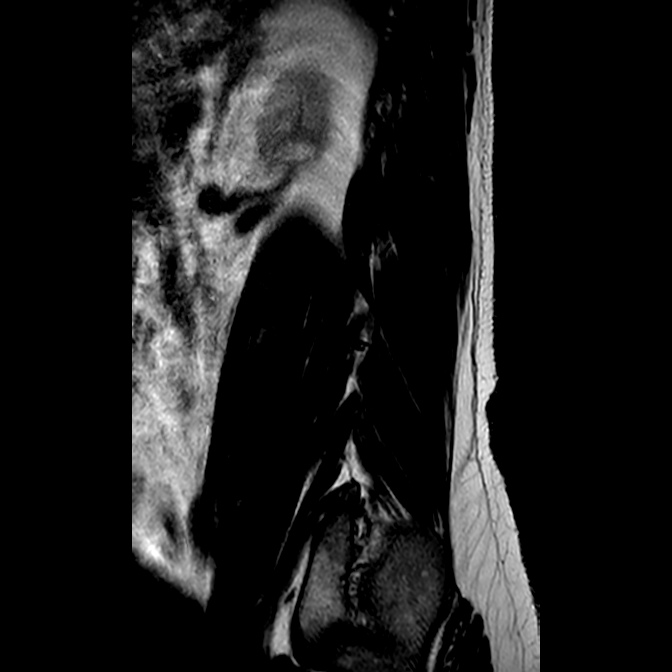
[im 10/19]
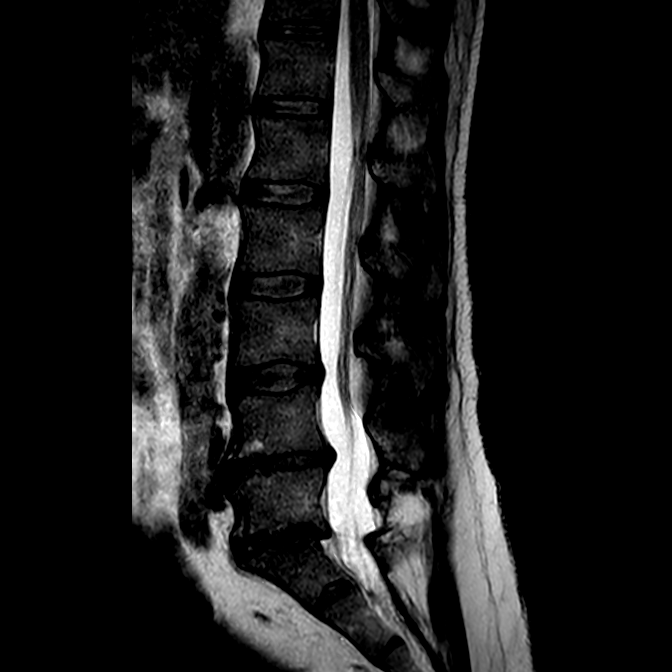
[im 19/19]
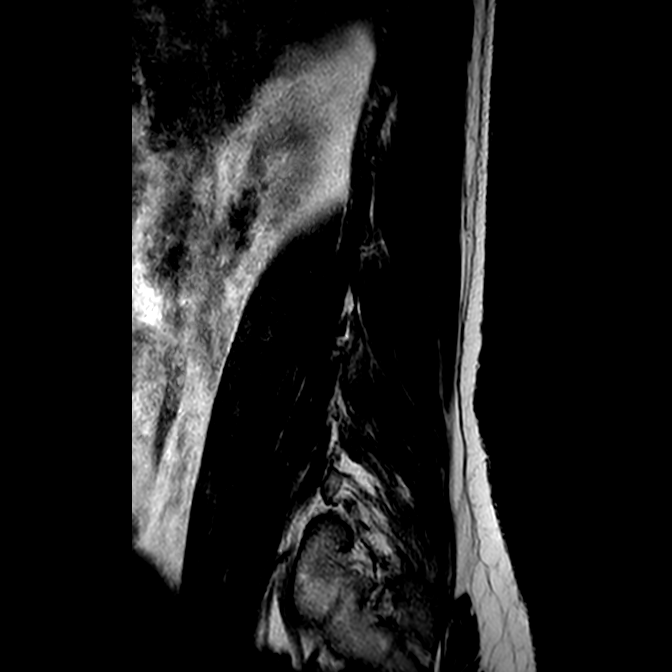

[12 of 48 positions shown; findings below may reference images not displayed]

FINDINGS: Lumbar vertebral heights are intact. There is marked disc narrowing at L5-S1, 
moderate to marked narrowing at L4-5. Lumbar lordosis is straightened. Conus 
terminates T12-L1. There is minimal Modic type I change at L5-S1. No evidence 
for spinal malignancy. Mild lower lumbar levocurvature. 
At L5-S1 there has been left laminectomy. There is approximately 3 mm 
retrolisthesis and mild posterior endplate osteophyte encroaching on the upper 
S1 lateral recesses. The canal is open. There is mild to moderate left foraminal 
stenosis. 
At L4-5 there is broad-based disc bulge and mild to moderate facet change with 
ligamentous thickening. There is no significant canal stenosis. Mild narrowing 
of the upper right L5 lateral recess. Mild right foraminal stenosis. 
At L3-4 there is borderline canal stenosis due to disc bulge and ligamentous 
thickening. There is a left far lateral disc extrusion measuring 12 x 5 mm, 
producing deformity and displacement of the left L3 ganglion. Right foramen 
open. 
At L2-3 and L1-2 the disc margins are intact and the canal and foramina are 
open.
IMPRESSION: Left far lateral disc extrusion at L3-4 and impinges the distal left L3 
ganglion. 
3 mm retrolisthesis and endplate osteophyte at L5-S1 encroaching on the upper S1 
lateral recesses. There has been left laminectomy at this level. Minimal Modic 
type I change at L5-S1. 
Mild narrowing of the upper right L5 lateral recess at L4-5.
